# Patient Record
Sex: Female | Born: 1985 | Race: Black or African American | Hispanic: No | Marital: Single | Smoking: Never smoker
Health system: Southern US, Community
[De-identification: ages and names within clinical notes are randomized; demographics above are authoritative.]

## PROBLEM LIST (undated history)

## (undated) DIAGNOSIS — F419 Anxiety disorder, unspecified: Secondary | ICD-10-CM

## (undated) DIAGNOSIS — F32A Depression, unspecified: Secondary | ICD-10-CM

---

## 2021-06-06 ENCOUNTER — Encounter (HOSPITAL_COMMUNITY): Payer: Self-pay

## 2021-06-06 ENCOUNTER — Emergency Department (HOSPITAL_COMMUNITY)
Admission: EM | Admit: 2021-06-06 | Discharge: 2021-06-06 | Disposition: A | Discharge status: Home / Self Care | Payer: Medicaid Other | Attending: Emergency Medicine | Admitting: Emergency Medicine

## 2021-06-06 DIAGNOSIS — M549 Dorsalgia, unspecified: Secondary | ICD-10-CM | POA: Diagnosis present

## 2021-06-06 DIAGNOSIS — M545 Low back pain, unspecified: Secondary | ICD-10-CM | POA: Insufficient documentation

## 2021-06-06 MED ORDER — LIDOCAINE 5 % EX PTCH: 1.0000 | MEDICATED_PATCH | CUTANEOUS | 0 refills | Status: DC

## 2021-06-06 NOTE — ED Provider Notes (Signed)
[facility]-EMERGENCY DEPT Provider Note   CSN: [id number] Arrival date & time: 06/06/21  0840     History  Chief Complaint  Patient presents with   Back Pain    Fall    Allison Tapia is a 36 y.o. female presenting with a complaint of back pain.  She was seen at urgent care 3 days ago and prescribed prednisone, Flexeril and encouraged to use Vicks vapor rub in the area.  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  She feels as though her back pain is not going away.  Denies any saddle anesthesia, bowel/bladder dysfunction, fevers, chills or history of cancer.  No history of back surgeries but did fall down this morning in the bathroom due to her back locking up. Denies LOC or hitting her head.   Home Medications Prior to Admission medications   Not on File      Allergies    Patient has no allergy information on record.    Review of Systems   Review of Systems  Musculoskeletal:  Positive for back pain.  See HPI Physical Exam Updated Vital Signs BP (!) 159/99 (BP Location: Left Arm)    Pulse 73    Temp 98.3 F (36.8 C) (Oral)    Resp 20    SpO2 98%  Physical Exam Vitals and nursing note reviewed.  Constitutional:      Appearance: Normal appearance.  HENT:     Head: Normocephalic and atraumatic.  Eyes:     General: No scleral icterus.    Conjunctiva/sclera: Conjunctivae normal.  Pulmonary:     Effort: Pulmonary effort is normal. No respiratory distress.  Musculoskeletal:        General: Normal range of motion.     Comments: Full range of motion of all levels of the spine.  No midline tenderness.  Minor bilateral paraspinal tenderness of the lumbar spine.  Skin:    Findings: No rash.  Neurological:     Mental Status: She is alert.     Gait: Gait normal.  Psychiatric:        Mood and Affect: Mood normal.    ED Results / Procedures / Treatments   Labs (all labs ordered are listed, but only abnormal  results are displayed) Labs Reviewed - No data to display  EKG None  Radiology No results found.  Procedures Procedures    Medications Ordered in ED Medications - No data to display  ED Course/ Medical Decision Making/ A&P                           Medical Decision Making  36 year old presenting with back pain.  She stated that "I did not do what I was supposed to do." Continues to have back pain however was active during the past few days which was cautioned against.  No midline tenderness, no imaging necessary.  She is still on prednisone and Flexeril however states she has not been taking them regularly.  I encouraged her to use these medications and have also sent lidocaine patches to the pharmacy.  She will also be discharged with a work note for the weekend.  She is agreeable to the plan.  Final Clinical Impression(s) / ED Diagnoses Final diagnoses:  Acute bilateral low back pain without sciatica    Rx / DC Orders Results and diagnoses were explained to the patient. Return precautions  discussed in full. Patient had no additional questions and expressed complete understanding.   This chart was dictated using voice recognition software.  Despite best efforts to proofread,  errors can occur which can change the documentation meaning.    Saddie Benders, PA-C 06/06/21 1128    Rolan Bucco, MD 06/06/21 [phone]

## 2021-06-06 NOTE — Discharge Instructions (Addendum)
I have attached information about back pain to these discharge papers.  A work note is also supplied.  Use heat packs to increase the blood flow to your tightened muscles.  You may also alternate Tylenol and ibuprofen.

## 2021-06-06 NOTE — ED Triage Notes (Signed)
Pt reports she went to [facility] on Sunday (2/7) for low back pain after lifting something heavy at her home. Pt was given IM steroid shot, muscle relaxer's, and prednisone to take at home.   Pt reports she works night shift and fell last night in the bathroom at work around 2am. Pt went home and took a muscle relaxer around 3am. Pt took 1000mg  tylenol this morning.    A/Ox4 Ambulatory in triage.

## 2021-06-11 ENCOUNTER — Encounter (HOSPITAL_COMMUNITY): Payer: Self-pay

## 2021-06-11 ENCOUNTER — Other Ambulatory Visit: Payer: Self-pay

## 2021-06-11 ENCOUNTER — Emergency Department (HOSPITAL_COMMUNITY)
Admission: EM | Admit: 2021-06-11 | Discharge: 2021-06-11 | Disposition: A | Discharge status: Home / Self Care | Payer: Medicaid Other | Attending: Emergency Medicine | Admitting: Emergency Medicine

## 2021-06-11 ENCOUNTER — Emergency Department (HOSPITAL_COMMUNITY): Payer: Medicaid Other

## 2021-06-11 DIAGNOSIS — Z20822 Contact with and (suspected) exposure to covid-19: Secondary | ICD-10-CM | POA: Diagnosis not present

## 2021-06-11 DIAGNOSIS — J040 Acute laryngitis: Secondary | ICD-10-CM | POA: Insufficient documentation

## 2021-06-11 DIAGNOSIS — J029 Acute pharyngitis, unspecified: Secondary | ICD-10-CM | POA: Diagnosis present

## 2021-06-11 LAB — GROUP A STREP BY PCR: Group A Strep by PCR: NOT DETECTED

## 2021-06-11 LAB — RESP PANEL BY RT-PCR (FLU A&B, COVID) ARPGX2
Influenza A by PCR: NEGATIVE
Influenza B by PCR: NEGATIVE
SARS Coronavirus 2 by RT PCR: NEGATIVE

## 2021-06-11 MED ORDER — AZITHROMYCIN 250 MG PO TABS: 250.0000 mg | ORAL_TABLET | Freq: Every day | ORAL | 0 refills | Status: DC

## 2021-06-11 NOTE — ED Triage Notes (Signed)
Pt reports sore throat and cough for a few days.

## 2021-06-11 NOTE — ED Notes (Signed)
Pt waiting on test results, water provided at pts request

## 2021-06-11 NOTE — ED Provider Notes (Signed)
°  [facility]-EMERGENCY DEPT [facility] Emergency Department Provider Note MRN:  [id number]  Arrival date & time: 06/11/21     Chief Complaint   Sore Throat   History of Present Illness   Allison Tapia is a 36 y.o. year-old female presents to the ED with chief complaint of sore throat, cough, hoarseness that started 3 days ago.  She denies fever.  Denies any successful home treatments.  Reports gradually worsening symptoms.    Review of Systems  Pertinent review of systems noted in HPI.    Physical Exam   Vitals:   06/11/21 1732  BP: (!) 151/115  Pulse: 88  Resp: 18  Temp: 98.5 F (36.9 C)  SpO2: 100%    CONSTITUTIONAL:  nontoxic-appearing, NAD NEURO:  Alert and oriented x 3, CN 3-12 grossly intact EYES:  eyes equal and reactive ENT/NECK:  Supple, no stridor, but hoarse, oropharynx is erythematous, no exudates or abscess CARDIO:  normal rate, regular rhythm, appears well-perfused  PULM:  No respiratory distress, no wheezing GI/GU:  non-distended,  MSK/SPINE:  No gross deformities, no edema, moves all extremities  SKIN:  no rash, atraumatic   *Additional and/or pertinent findings included in MDM below  Diagnostic and Interventional Summary    EKG Interpretation  Date/Time:    Ventricular Rate:    PR Interval:    QRS Duration:   QT Interval:    QTC Calculation:   R Axis:     Text Interpretation:         Labs Reviewed  RESP PANEL BY RT-PCR (FLU A&B, COVID) ARPGX2  GROUP A STREP BY PCR    DG Chest 2 View  Final Result      Medications - No data to display   Procedures  /  Critical Care Procedures  ED Course and Medical Decision Making  I have reviewed the triage vital signs, the nursing notes, and pertinent available records from the EMR.  Complexity of Problems Addressed: Moderate Complexity: Acute illness with systemic symptoms, requiring diagnostic workup to rule out more severe disease.  ED Course:    After considering the following  differential, pneumonia, covid, flu, strep throat, asthma exacerbation, I agree with labs and CXR ordered in triage.  I personally interpreted the labs which are notable for negative covid, flu, strep .  Treatment and Plan:  Will trial treatment of laryngitis with azithro  Emergency department workup does not suggest an emergent condition requiring admission or immediate intervention beyond  what has been performed at this time. The patient is safe for discharge and has  been instructed to return immediately for worsening symptoms, change in  symptoms or any other concerns    Final Clinical Impressions(s) / ED Diagnoses     ICD-10-CM   1. Laryngitis  J04.0       ED Discharge Orders          Ordered    azithromycin (ZITHROMAX) 250 MG tablet  Daily        06/11/21 2102             Discharge Instructions Discussed with and Provided to Patient:   Discharge Instructions   None      Felipa Furnace 06/11/21 2103    Cheryll Cockayne, MD 06/15/21 [phone]

## 2021-06-11 NOTE — ED Provider Triage Note (Signed)
Emergency Medicine Provider Triage Evaluation Note  Allison Tapia , a 36 y.o. female  was evaluated in triage.  Pt complains of sore throat hoarseness of voice, congestion, and productive cough for the last 4 days..  Review of Systems  Positive: Cough, congestion, sore throat, hoarseness of voice, diarrhea Negative: Fevers, chills, nausea, vomiting  Physical Exam  BP (!) 151/115 (BP Location: Left Arm)    Pulse 88    Temp 98.5 F (36.9 C) (Oral)    Resp 18    SpO2 100%  Gen:   Awake, no distress   Resp:  Normal effort  MSK:   Moves extremities without difficulty  Other:  Hoarseness of voice.  Erythematous edematous tonsils, exam limited by patient participation.  Anterior cervical lymphadenopathy.  No anterior neck crepitus or tracheal deviation.  Patient tolerating her secretions well.  Medical Decision Making  Medically screening exam initiated at 5:47 PM.  Appropriate orders placed.  Allison Tapia was informed that the remainder of the evaluation will be completed by another provider, this initial triage assessment does not replace that evaluation, and the importance of remaining in the ED until their evaluation is complete.  This chart was dictated using voice recognition software, Dragon. Despite the best efforts of this provider to proofread and correct errors, errors may still occur which can change documentation meaning.    Paris Lore, PA-C 06/11/21 [phone]

## 2021-06-23 ENCOUNTER — Emergency Department (HOSPITAL_COMMUNITY)
Admission: EM | Admit: 2021-06-23 | Discharge: 2021-06-24 | Disposition: A | Discharge status: Home / Self Care | Payer: Medicaid Other | Attending: Emergency Medicine | Admitting: Emergency Medicine

## 2021-06-23 ENCOUNTER — Emergency Department (HOSPITAL_COMMUNITY): Payer: Medicaid Other

## 2021-06-23 ENCOUNTER — Encounter (HOSPITAL_COMMUNITY): Payer: Self-pay | Admitting: *Deleted

## 2021-06-23 ENCOUNTER — Other Ambulatory Visit: Payer: Self-pay

## 2021-06-23 DIAGNOSIS — Z20822 Contact with and (suspected) exposure to covid-19: Secondary | ICD-10-CM | POA: Insufficient documentation

## 2021-06-23 DIAGNOSIS — R059 Cough, unspecified: Secondary | ICD-10-CM | POA: Diagnosis present

## 2021-06-23 DIAGNOSIS — J189 Pneumonia, unspecified organism: Secondary | ICD-10-CM

## 2021-06-23 DIAGNOSIS — J168 Pneumonia due to other specified infectious organisms: Secondary | ICD-10-CM | POA: Diagnosis not present

## 2021-06-23 DIAGNOSIS — N9489 Other specified conditions associated with female genital organs and menstrual cycle: Secondary | ICD-10-CM | POA: Diagnosis not present

## 2021-06-23 LAB — CBC WITH DIFFERENTIAL/PLATELET
Abs Immature Granulocytes: 0.03 10*3/uL (ref 0.00–0.07)
Basophils Absolute: 0 10*3/uL (ref 0.0–0.1)
Basophils Relative: 1 %
Eosinophils Absolute: 0.3 10*3/uL (ref 0.0–0.5)
Eosinophils Relative: 5 %
HCT: 43 % (ref 36.0–46.0)
Hemoglobin: 13.9 g/dL (ref 12.0–15.0)
Immature Granulocytes: 1 %
Lymphocytes Relative: 18 %
Lymphs Abs: 1 10*3/uL (ref 0.7–4.0)
MCH: 28.4 pg (ref 26.0–34.0)
MCHC: 32.3 g/dL (ref 30.0–36.0)
MCV: 87.8 fL (ref 80.0–100.0)
Monocytes Absolute: 0.7 10*3/uL (ref 0.1–1.0)
Monocytes Relative: 12 %
Neutro Abs: 3.8 10*3/uL (ref 1.7–7.7)
Neutrophils Relative %: 63 %
Platelets: 241 10*3/uL (ref 150–400)
RBC: 4.9 MIL/uL (ref 3.87–5.11)
RDW: 13.8 % (ref 11.5–15.5)
WBC: 5.8 10*3/uL (ref 4.0–10.5)
nRBC: 0 % (ref 0.0–0.2)

## 2021-06-23 LAB — I-STAT BETA HCG BLOOD, ED (MC, WL, AP ONLY): I-stat hCG, quantitative: <5 m[IU]/mL (ref <5)

## 2021-06-23 NOTE — ED Provider Triage Note (Signed)
Emergency Medicine Provider Triage Evaluation Note  Allison Tapia , a 36 y.o. female  was evaluated in triage.  Pt complains of fevers, and coughing.  She was recently seen for laryngitis, started on azithromycin.  She states she was doing well until this weekend when she started to feel hot.  She only found out she was febrile today.  She states that she began feeling worse today and just generally unwell.   Physical Exam  BP (!) 142/101    Pulse (!) 115    Temp (!) 100.6 F (38.1 C) (Oral)    Resp 17    Ht 5\' 5"  (1.651 m)    Wt 113.4 kg    LMP  (LMP Unknown)    SpO2 95%    BMI 41.60 kg/m  Gen:   Awake, no distress   Resp:  Normal effort  Frequent cough MSK:   Moves extremities without difficulty  Other:  Normal speech.   Medical Decision Making  Medically screening exam initiated at 11:36 PM.  Appropriate orders placed.  Ambri Da was informed that the remainder of the evaluation will be completed by another provider, this initial triage assessment does not replace that evaluation, and the importance of remaining in the ED until their evaluation is complete.  Patient was recently seen and treated with azithromycin for laryngitis. She has improved followed by a worsening today, and she is tachycardic and febrile with ongoing cough. We will order labs.  While she had negative COVID testing at the time of her laryngitis diagnosis given that she has been in healthcare facility will recheck this today.   Lorin Glass, [place] 06/24/21 0004

## 2021-06-23 NOTE — ED Triage Notes (Addendum)
Pt says dx with laryngitis last week. Pt says she was getting ready to go to work today and started to feel hot all over and vomited x2. Has had a cough, says that when she starts coughing she feels like she cannot stop and then starts vomiting.

## 2021-06-24 LAB — RESP PANEL BY RT-PCR (FLU A&B, COVID) ARPGX2
Influenza A by PCR: NEGATIVE
Influenza B by PCR: NEGATIVE
SARS Coronavirus 2 by RT PCR: NEGATIVE

## 2021-06-24 LAB — COMPREHENSIVE METABOLIC PANEL
ALT: 20 U/L (ref 0–44)
AST: 19 U/L (ref 15–41)
Albumin: 3.6 g/dL (ref 3.5–5.0)
Alkaline Phosphatase: 58 U/L (ref 38–126)
Anion gap: 6 (ref 5–15)
BUN: 10 mg/dL (ref 6–20)
CO2: 25 mmol/L (ref 22–32)
Calcium: 8.9 mg/dL (ref 8.9–10.3)
Chloride: 106 mmol/L (ref 98–111)
Creatinine, Ser: 0.73 mg/dL (ref 0.44–1.00)
GFR, Estimated: >60 mL/min (ref >60)
Glucose, Bld: 112 mg/dL — ABNORMAL HIGH (ref 70–99)
Potassium: 3.6 mmol/L (ref 3.5–5.1)
Sodium: 137 mmol/L (ref 135–145)
Total Bilirubin: 0.3 mg/dL (ref 0.3–1.2)
Total Protein: 7.4 g/dL (ref 6.5–8.1)

## 2021-06-24 LAB — URINALYSIS, ROUTINE W REFLEX MICROSCOPIC
Bilirubin Urine: NEGATIVE
Glucose, UA: NEGATIVE mg/dL
Ketones, ur: 5 mg/dL — AB
Nitrite: NEGATIVE
Protein, ur: 30 mg/dL — AB
Specific Gravity, Urine: 1.03 (ref 1.005–1.030)
pH: 5 (ref 5.0–8.0)

## 2021-06-24 MED ORDER — ONDANSETRON 4 MG PO TBDP: 4.0000 mg | ORAL_TABLET | Freq: Three times a day (TID) | ORAL | 0 refills | Status: DC | PRN

## 2021-06-24 MED ORDER — ONDANSETRON 4 MG PO TBDP: 4.0000 mg | ORAL_TABLET | Freq: Three times a day (TID) | ORAL | 0 refills | Status: AC | PRN

## 2021-06-24 MED ORDER — AMOXICILLIN-POT CLAVULANATE 875-125 MG PO TABS
1.0000 | ORAL_TABLET | Freq: Once | ORAL | Status: AC
  Administered 2021-06-24: 1 via ORAL
  Filled 2021-06-24: qty 1

## 2021-06-24 MED ORDER — TUSSIN DM 100-10 MG/5ML PO LIQD
10.0000 mL | Freq: Four times a day (QID) | ORAL | 0 refills | Status: DC | PRN
Start: 2021-06-24 — End: 2021-06-24

## 2021-06-24 MED ORDER — TUSSIN DM 100-10 MG/5ML PO LIQD: 10.0000 mL | Freq: Four times a day (QID) | ORAL | 0 refills | Status: DC | PRN

## 2021-06-24 MED ORDER — AMOXICILLIN-POT CLAVULANATE 875-125 MG PO TABS: 1.0000 | ORAL_TABLET | Freq: Two times a day (BID) | ORAL | 0 refills | Status: DC

## 2021-06-24 MED ORDER — AMOXICILLIN-POT CLAVULANATE 875-125 MG PO TABS: 1.0000 | ORAL_TABLET | Freq: Two times a day (BID) | ORAL | 0 refills | Status: AC

## 2021-06-24 NOTE — ED Provider Notes (Signed)
[facility]-EMERGENCY DEPT Provider Note  CSN: [id number] Arrival date & time: 06/23/21 2304  Chief Complaint(s) Cough  HPI Allison Tapia is a 36 y.o. female recently treated for laryngitis returns today for worsening cough and congestion with recurrent fevers.  Patient is endorsing emesis related to severe coughing.  No abdominal pain.  No other physical complaints.  The history is provided by the patient.   Past Medical History History reviewed. No pertinent past medical history. There are no problems to display for this patient.  Home Medication(s) Prior to Admission medications   Medication Sig Start Date End Date Taking? Authorizing Provider  amoxicillin-clavulanate (AUGMENTIN) 875-125 MG tablet Take 1 tablet by mouth every 12 (twelve) hours for 7 days. 06/24/21 07/01/21 Yes Elijan Googe, Amadeo Garnet, MD  dextromethorphan-guaiFENesin (TUSSIN DM) 10-100 MG/5ML liquid Take 10 mLs by mouth every 6 (six) hours as needed for cough. 06/24/21  Yes Kenleigh Toback, Amadeo Garnet, MD  ondansetron (ZOFRAN-ODT) 4 MG disintegrating tablet Take 1 tablet (4 mg total) by mouth every 8 (eight) hours as needed for up to 3 days for nausea or vomiting. 06/24/21 06/27/21 Yes Aikeem Lilley, Amadeo Garnet, MD  azithromycin (ZITHROMAX) 250 MG tablet Take 1 tablet (250 mg total) by mouth daily. Take first 2 tablets together, then 1 every day until finished. 06/11/21   Roxy Horseman, PA-C  lidocaine (LIDODERM) 5 % Place 1 patch onto the skin daily. Remove & Discard patch within 12 hours or as directed by MD 06/06/21   Redwine, [name], PA-C                                                                                                                                    Allergies Patient has no known allergies.  Review of Systems Review of Systems As noted in HPI  Physical Exam Vital Signs  I have reviewed the triage vital signs BP (!) 141/101    Pulse 89    Temp (!) 100.6 F (38.1 C) (Oral)    Resp  18    Ht 5\' 5"  (1.651 m)    Wt 113.4 kg    LMP  (LMP Unknown)    SpO2 98%    BMI 41.60 kg/m   Physical Exam Vitals reviewed.  Constitutional:      General: She is not in acute distress.    Appearance: She is well-developed. She is not diaphoretic.  HENT:     Head: Normocephalic and atraumatic.     Nose: Nose normal.  Eyes:     General: No scleral icterus.       Right eye: No discharge.        Left eye: No discharge.     Conjunctiva/sclera: Conjunctivae normal.     Pupils: Pupils are equal, round, and reactive to light.  Cardiovascular:     Rate and Rhythm: Normal rate and regular rhythm.     Heart sounds: No murmur heard.  No friction rub. No gallop.  Pulmonary:     Effort: Pulmonary effort is normal. No respiratory distress.     Breath sounds: No stridor. Examination of the right-lower field reveals rhonchi and rales. Rhonchi and rales present. No wheezing.  Abdominal:     General: There is no distension.     Palpations: Abdomen is soft.     Tenderness: There is no abdominal tenderness.  Musculoskeletal:        General: No tenderness.     Cervical back: Normal range of motion and neck supple.  Skin:    General: Skin is warm and dry.     Findings: No erythema or rash.  Neurological:     Mental Status: She is alert and oriented to person, place, and time.    ED Results and Treatments Labs (all labs ordered are listed, but only abnormal results are displayed) Labs Reviewed  COMPREHENSIVE METABOLIC PANEL - Abnormal; Notable for the following components:      Result Value   Glucose, Bld 112 (*)    All other components within normal limits  URINALYSIS, ROUTINE W REFLEX MICROSCOPIC - Abnormal; Notable for the following components:   APPearance HAZY (*)    Hgb urine dipstick MODERATE (*)    Ketones, ur 5 (*)    Protein, ur 30 (*)    Leukocytes,Ua LARGE (*)    Bacteria, UA RARE (*)    All other components within normal limits  RESP PANEL BY RT-PCR (FLU A&B, COVID)  ARPGX2  CBC WITH DIFFERENTIAL/PLATELET  LACTIC ACID, PLASMA  LACTIC ACID, PLASMA  I-STAT BETA HCG BLOOD, ED ([facility], [facility], [facility] ONLY)                                                                                                                         EKG  EKG Interpretation  Date/Time:    Ventricular Rate:    PR Interval:    QRS Duration:   QT Interval:    QTC Calculation:   R Axis:     Text Interpretation:         Radiology DG Chest 2 View  Result Date: 06/23/2021 CLINICAL DATA:  Cough, fever, body aches 1 week EXAM: CHEST - 2 VIEW COMPARISON:  06/11/2021 FINDINGS: Frontal and lateral views of the chest demonstrate an unremarkable cardiac silhouette. Patchy right lower lobe consolidation consistent with pneumonia. No effusion or pneumothorax. No acute bony abnormalities. IMPRESSION: 1. Patchy right lower lobe bronchopneumonia. Electronically Signed   By: Sharlet Salina M.D.   On: 06/23/2021 23:56    Pertinent labs & imaging results that were available during my care of the patient were reviewed by me and considered in my medical decision making (see MDM for details).  Medications Ordered in ED Medications  amoxicillin-clavulanate (AUGMENTIN) 875-125 MG per tablet 1 tablet (1 tablet Oral Given 06/24/21 0354)  Procedures Procedures  (including critical care time)  Medical Decision Making / ED Course         Patient noted to be febrile.  Recent laryngitis.  We will need to assess for postviral pneumonia.  We will get labs to rule out sepsis.   Work-up ordered to assess concerns above.  Labs and imaging independently interpreted by me and noted below: CBC without leukocytosis or anemia No significant electrolyte derangements or renal sufficiency. UA was obtained in triage and contaminated.  Patient is not having any urinary symptoms concerning for  infection. Chest x-ray is notable for right base pneumonia  Management: Monitored. Patient is hemodynamically stable nontachycardic and satting well on room air. Patient's work-up is not consistent with sepsis and she does not require admission to the hospital Oral dose of antibiotics   Final Clinical Impression(s) / ED Diagnoses Final diagnoses:  Pneumonia of right lower lobe due to infectious organism   The patient appears reasonably screened and/or stabilized for discharge and I doubt any other medical condition or other [facility] requiring further screening, evaluation, or treatment in the ED at this time prior to discharge. Safe for discharge with strict return precautions.  Disposition: Discharge  Condition: Good  I have discussed the results, Dx and Tx plan with the patient/family who expressed understanding and agree(s) with the plan. Discharge instructions discussed at length. The patient/family was given strict return precautions who verbalized understanding of the instructions. No further questions at time of discharge.    ED Discharge Orders          Ordered    ondansetron (ZOFRAN-ODT) 4 MG disintegrating tablet  Every 8 hours PRN        06/24/21 0401    amoxicillin-clavulanate (AUGMENTIN) 875-125 MG tablet  Every 12 hours        06/24/21 0401    dextromethorphan-guaiFENesin (TUSSIN DM) 10-100 MG/5ML liquid  Every 6 hours PRN        06/24/21 0402            Follow Up: Primary care provider  Call  if you do not have a primary care physician, contact [facility] at [phone] for referral           This chart was dictated using voice recognition software.  Despite best efforts to proofread,  errors can occur which can change the documentation meaning.    Nira Conn, MD 06/24/21 [phone]

## 2021-07-21 ENCOUNTER — Other Ambulatory Visit: Payer: Self-pay

## 2021-07-21 ENCOUNTER — Encounter (HOSPITAL_COMMUNITY): Payer: Self-pay

## 2021-07-21 ENCOUNTER — Emergency Department (HOSPITAL_COMMUNITY)
Admission: EM | Admit: 2021-07-21 | Discharge: 2021-07-21 | Disposition: A | Discharge status: Home / Self Care | Payer: Medicaid Other | Attending: Student | Admitting: Student

## 2021-07-21 DIAGNOSIS — J029 Acute pharyngitis, unspecified: Secondary | ICD-10-CM

## 2021-07-21 DIAGNOSIS — J069 Acute upper respiratory infection, unspecified: Secondary | ICD-10-CM | POA: Insufficient documentation

## 2021-07-21 DIAGNOSIS — R0981 Nasal congestion: Secondary | ICD-10-CM | POA: Diagnosis present

## 2021-07-21 NOTE — ED Provider Notes (Signed)
?[facility]-EMERGENCY DEPT ?Provider Note ? ? ?CSN: [id number] ?Arrival date & time: 07/21/21  1945 ? ?  ? ?History ? ?Chief Complaint  ?Patient presents with  ? Nasal Congestion  ? ? ?Allison Tapia is a 36 y.o. female. ? ?Patient with no past medical history presents to the emergency department for evaluation of runny nose and nasal congestion starting this morning.  While waiting in the emergency department she has developed a sore throat and headache.  No fevers, ear pain, vomiting.  No abdominal pain or urinary symptoms.  She has used over-the-counter cold medication at home which has helped.  Her daughter is sick with similar symptoms, preceding her own.  The onset of this condition was acute. The course is constant. Aggravating factors: none. Alleviating factors: none.  ? ? ? ?  ? ?Home Medications ?Prior to Admission medications   ?Medication Sig Start Date End Date Taking? Authorizing Provider  ?azithromycin (ZITHROMAX) 250 MG tablet Take 1 tablet (250 mg total) by mouth daily. Take first 2 tablets together, then 1 every day until finished. 06/11/21   Roxy Horseman, PA-C  ?dextromethorphan-guaiFENesin (TUSSIN DM) 10-100 MG/5ML liquid Take 10 mLs by mouth every 6 (six) hours as needed for cough. 06/24/21   Nira Conn, MD  ?lidocaine (LIDODERM) 5 % Place 1 patch onto the skin daily. Remove & Discard patch within 12 hours or as directed by MD 06/06/21   Redwine, [name], PA-C  ?   ? ?Allergies    ?Patient has no known allergies.   ? ?Review of Systems   ?Review of Systems ? ?Physical Exam ?Updated Vital Signs ?BP (!) 163/125 (BP Location: Right Arm)   Pulse 97   Temp 98.4 ?F (36.9 ?C) (Oral)   Resp 18   Ht 5\' 5"  (1.651 m)   Wt 113.4 kg   SpO2 97%   BMI 41.60 kg/m?  ?Physical Exam ?Vitals and nursing note reviewed.  ?Constitutional:   ?   General: She is not in acute distress. ?   Appearance: She is well-developed.  ?HENT:  ?   Head: Normocephalic and atraumatic.  ?    Right Ear: External ear normal.  ?   Left Ear: External ear normal.  ?   Nose: Congestion and rhinorrhea present.  ?   Mouth/Throat:  ?   Pharynx: Posterior oropharyngeal erythema present. No oropharyngeal exudate.  ?Eyes:  ?   Conjunctiva/sclera: Conjunctivae normal.  ?Cardiovascular:  ?   Rate and Rhythm: Normal rate and regular rhythm.  ?   Heart sounds: No murmur heard. ?Pulmonary:  ?   Effort: No respiratory distress.  ?   Breath sounds: No wheezing, rhonchi or rales.  ?Abdominal:  ?   Palpations: Abdomen is soft.  ?   Tenderness: There is no abdominal tenderness. There is no guarding or rebound.  ?Musculoskeletal:  ?   Cervical back: Normal range of motion and neck supple.  ?   Right lower leg: No edema.  ?   Left lower leg: No edema.  ?Skin: ?   General: Skin is warm and dry.  ?   Findings: No rash.  ?Neurological:  ?   General: No focal deficit present.  ?   Mental Status: She is alert. Mental status is at baseline.  ?   Motor: No weakness.  ?Psychiatric:     ?   Mood and Affect: Mood normal.  ? ? ?ED Results / Procedures / Treatments   ?Labs ?(all labs ordered are  listed, but only abnormal results are displayed) ?Labs Reviewed - No data to display ? ?EKG ?None ? ?Radiology ?No results found. ? ?Procedures ?Procedures  ? ? ?Medications Ordered in ED ?Medications - No data to display ? ?ED Course/ Medical Decision Making/ A&P ?  ? ?Patient seen and examined. History obtained directly from patient.  Discussed COVID, flu, strep testing --patient declines.  She is requesting a work note as she missed work today. ? ?Most recent vital signs reviewed and are as follows: ?BP (!) 163/125 (BP Location: Right Arm)   Pulse 97   Temp 98.4 ?F (36.9 ?C) (Oral)   Resp 18   Ht 5\' 5"  (1.651 m)   Wt 113.4 kg   SpO2 97%   BMI 41.60 kg/m?  ? ?Initial impression: Upper respiratory infection ? ?Home treatment plan: Symptomatic care ? ?Return instructions discussed with patient: Worsening sore throat, trouble swallowing,  fever ? ?Follow-up instructions discussed with patient: Encouraged follow-up in the next 3 to 5 days if not improving ? ?                        ?Medical Decision Making ? ?Patient with symptoms consistent with a viral syndrome. Vitals are stable, no fever. No signs of dehydration. Lung exam normal, no signs of pneumonia. Supportive therapy indicated with return if symptoms worsen.   ? ?BP high, no obvious signs of endorgan damage, encouraged recheck. ? ? ? ? ? ? ? ? ?Final Clinical Impression(s) / ED Diagnoses ?Final diagnoses:  ?Upper respiratory tract infection, unspecified type  ?Sore throat  ? ? ?Rx / DC Orders ?ED Discharge Orders   ? ? None  ? ?  ? ? ?  ? , PA-C ?07/21/21 2242 ? ?  ?2243, MD ?07/22/21 0038 ? ?

## 2021-07-21 NOTE — ED Triage Notes (Addendum)
Pt presents to ED with c/o nasal congestion x 1 day. Pt states she needs a work for note.  ?

## 2021-07-21 NOTE — Discharge Instructions (Addendum)
Please read and follow all provided instructions. ? ?Your diagnoses today include:  ?1. Upper respiratory tract infection, unspecified type   ?2. Sore throat   ? ? ?You appear to have an upper respiratory infection (URI). An upper respiratory tract infection, or cold, is a viral infection of the air passages leading to the lungs. It should improve gradually after 5-7 days. You may have a lingering cough that lasts for 2- 4 weeks after the infection. ? ?Tests performed today include: ?Vital signs. See below for your results today.  ? ?Medications prescribed:  ? ?Take any prescribed medications only as directed. Treatment for your infection is aimed at treating the symptoms. There are no medications, such as antibiotics, that will cure your infection.  ? ?Home care instructions:  ?You can take Tylenol and/or Ibuprofen as directed on the packaging for fever reduction and pain relief.  ?  ?For cough: honey 1/2 to 1 teaspoon (you can dilute the honey in water or another fluid).  You can also use guaifenesin and dextromethorphan for cough. You can use a humidifier for chest congestion and cough.  If you don't have a humidifier, you can sit in the bathroom with the hot shower running.    ?  ?For sore throat: try warm salt water gargles, cepacol lozenges, throat spray, warm tea or water with lemon/honey, popsicles or ice, or OTC cold relief medicine for throat discomfort.  ?  ?For congestion: take a daily anti-histamine like Zyrtec, Claritin, and a oral decongestant, such as pseudoephedrine.  You can also use Flonase 1-2 sprays in each nostril daily.  ?  ?It is important to stay hydrated: drink plenty of fluids (water, gatorade/powerade/pedialyte, juices, or teas) to keep your throat moisturized and help further relieve irritation/discomfort.  ? ?Your illness is contagious and can be spread to others, especially during the first 3 or 4 days. It cannot be cured by antibiotics or other medicines. Take basic precautions such  as washing your hands often, covering your mouth when you cough or sneeze, and avoiding public places where you could spread your illness to others.  ? ?Please continue drinking plenty of fluids.  Use over-the-counter medicines as needed as directed on packaging for symptom relief.  You may also use ibuprofen or tylenol as directed on packaging for pain or fever.  Do not take multiple medicines containing Tylenol or acetaminophen to avoid taking too much of this medication. ? ?Follow-up instructions: ?Please follow-up with your primary care provider in the next 3 days for further evaluation of your symptoms if you are not feeling better.  ? ?Return instructions:  ?Please return to the Emergency Department if you experience worsening symptoms.  ?RETURN IMMEDIATELY IF you develop shortness of breath, confusion or altered mental status, a new rash, become dizzy, faint, or poorly responsive, or are unable to be cared for at home. ?Please return if you have persistent vomiting and cannot keep down fluids or develop a fever that is not controlled by tylenol or motrin.   ?Please return if you have any other emergent concerns. ? ?Additional Information: ? ?Your vital signs today were: ?BP (!) 163/125 (BP Location: Right Arm)   Pulse 97   Temp 98.4 ?F (36.9 ?C) (Oral)   Resp 18   Ht 5\' 5"  (1.651 m)   Wt 113.4 kg   SpO2 97%   BMI 41.60 kg/m?  ?If your blood pressure (BP) was elevated above 135/85 this visit, please have this repeated by your doctor within one month. ?-------------- ? ?

## 2022-01-26 ENCOUNTER — Other Ambulatory Visit: Payer: Self-pay

## 2022-01-26 ENCOUNTER — Encounter (HOSPITAL_COMMUNITY): Payer: Self-pay

## 2022-01-26 ENCOUNTER — Emergency Department (HOSPITAL_BASED_OUTPATIENT_CLINIC_OR_DEPARTMENT_OTHER)
Admission: RE | Admit: 2022-01-26 | Discharge: 2022-01-26 | Disposition: A | Discharge status: Home / Self Care | Payer: Medicaid Other | Source: Ambulatory Visit | Attending: Emergency Medicine | Admitting: Emergency Medicine

## 2022-01-26 ENCOUNTER — Emergency Department (HOSPITAL_COMMUNITY)
Admission: EM | Admit: 2022-01-26 | Discharge: 2022-01-26 | Disposition: A | Discharge status: Home / Self Care | Payer: Medicaid Other | Attending: Emergency Medicine | Admitting: Emergency Medicine

## 2022-01-26 DIAGNOSIS — M79662 Pain in left lower leg: Secondary | ICD-10-CM | POA: Diagnosis present

## 2022-01-26 DIAGNOSIS — M79605 Pain in left leg: Secondary | ICD-10-CM

## 2022-01-26 DIAGNOSIS — R52 Pain, unspecified: Secondary | ICD-10-CM | POA: Diagnosis not present

## 2022-01-26 MED ORDER — NAPROXEN 500 MG PO TABS: 500.0000 mg | ORAL_TABLET | Freq: Two times a day (BID) | ORAL | 0 refills | Status: DC

## 2022-01-26 NOTE — ED Triage Notes (Addendum)
Pt reports left leg pain from knee radiating to ankle with swelling x1 week  Reports using ice, ace wrap, biofreeze, tylenol, and ibuprofen with relief.   Pain started after workings multiple 16hr shifts, pt normally works 8hr shifts.

## 2022-01-26 NOTE — Progress Notes (Signed)
VASCULAR LAB    Left lower extremity venous duplex has been performed.  See CV proc for preliminary results.   Jillien Yakel, RVT 01/26/2022, 10:58 AM

## 2022-01-26 NOTE — ED Provider Notes (Signed)
[facility]-EMERGENCY DEPT Provider Note   CSN: [id number] Arrival date & time: 01/26/22  0225     History  Chief Complaint  Patient presents with   Leg Pain    [facility]ia is a 36 y.o. female.  Patient is a 36 year old female with no significant past medical history.  Patient presenting today with complaints of left leg pain.  She describes pain in her lower leg from below the knee down to the ankle.  The pain is primarily in the front of the leg.  She denies any specific injury or trauma, but does tell me that she has been on her feet much more lately at work due to pulling double shifts.  She denies any chest pain or difficulty breathing.  Pain is worse when she ambulates with no alleviating factors.  The history is provided by the patient.       Home Medications Prior to Admission medications   Medication Sig Start Date End Date Taking? Authorizing Provider  azithromycin (ZITHROMAX) 250 MG tablet Take 1 tablet (250 mg total) by mouth daily. Take first 2 tablets together, then 1 every day until finished. 06/11/21   Roxy Horseman, PA-C  dextromethorphan-guaiFENesin (TUSSIN DM) 10-100 MG/5ML liquid Take 10 mLs by mouth every 6 (six) hours as needed for cough. 06/24/21   Cardama, Amadeo Garnet, MD  lidocaine (LIDODERM) 5 % Place 1 patch onto the skin daily. Remove & Discard patch within 12 hours or as directed by MD 06/06/21   Redwine, [name], PA-C      Allergies    Patient has no known allergies.    Review of Systems   Review of Systems  All other systems reviewed and are negative.   Physical Exam Updated Vital Signs BP (!) 154/103 (BP Location: Left Arm)   Pulse 83   Temp 98.6 F (37 C) (Oral)   Resp 18   Ht 5\' 5"  (1.651 m)   Wt 127.9 kg   SpO2 98%   BMI 46.93 kg/m  Physical Exam Vitals and nursing note reviewed.  Constitutional:      General: She is not in acute distress.    Appearance: Normal appearance. She is not ill-appearing.   HENT:     Head: Normocephalic and atraumatic.  Pulmonary:     Effort: Pulmonary effort is normal.  Musculoskeletal:     Comments: Left lower extremity is grossly normal in appearance.  There is tenderness to palpation to the soft tissues of the anterior lower leg.  DP pulses are easily palpable and motor and sensation are intact throughout the entire foot.  Skin:    General: Skin is warm and dry.  Neurological:     Mental Status: She is alert and oriented to person, place, and time.     ED Results / Procedures / Treatments   Labs (all labs ordered are listed, but only abnormal results are displayed) Labs Reviewed - No data to display  EKG None  Radiology No results found.  Procedures Procedures    Medications Ordered in ED Medications - No data to display  ED Course/ Medical Decision Making/ A&P  Patient presenting with leg pain as described in the HPI.  I suspect a musculoskeletal etiology due to her increased volume of work.  Vital signs are stable and physical examination is remarkable for tenderness to palpation in the soft tissues of the lower leg.  ' sign is absent.  I doubt DVT, but will arrange an ultrasound for the  morning as they are not here at night to perform the study.  Patient will also be prescribed naproxen for what I suspect is a musculoskeletal soft tissue pain.  Final Clinical Impression(s) / ED Diagnoses Final diagnoses:  None    Rx / DC Orders ED Discharge Orders     None         [name], MD 01/26/22 [phone]

## 2022-01-26 NOTE — Discharge Instructions (Signed)
Begin taking naproxen as prescribed.  Return today at the given time for an ultrasound of your leg to rule out a blood clot.  Rest.

## 2022-04-30 ENCOUNTER — Other Ambulatory Visit: Payer: Self-pay

## 2022-04-30 ENCOUNTER — Emergency Department (HOSPITAL_COMMUNITY)
Admission: EM | Admit: 2022-04-30 | Discharge: 2022-04-30 | Disposition: A | Discharge status: Home / Self Care | Payer: BC Managed Care – PPO | Attending: Emergency Medicine | Admitting: Emergency Medicine

## 2022-04-30 ENCOUNTER — Encounter (HOSPITAL_COMMUNITY): Payer: Self-pay

## 2022-04-30 DIAGNOSIS — J029 Acute pharyngitis, unspecified: Secondary | ICD-10-CM | POA: Diagnosis not present

## 2022-04-30 MED ORDER — DIPHENHYDRAMINE HCL 50 MG/ML IJ SOLN: 50.0000 mg | Freq: Once | INTRAMUSCULAR | Status: DC

## 2022-04-30 MED ORDER — ACETAMINOPHEN 160 MG/5ML PO SOLN
650.0000 mg | Freq: Once | ORAL | Status: AC
  Administered 2022-04-30: 650 mg via ORAL
  Filled 2022-04-30: qty 20.3

## 2022-04-30 MED ORDER — DEXAMETHASONE 4 MG PO TABS
10.0000 mg | ORAL_TABLET | Freq: Once | ORAL | Status: AC
  Administered 2022-04-30: 10 mg via ORAL
  Filled 2022-04-30: qty 1

## 2022-04-30 MED ORDER — DEXAMETHASONE SODIUM PHOSPHATE 10 MG/ML IJ SOLN: 10.0000 mg | Freq: Once | INTRAMUSCULAR | Status: DC

## 2022-04-30 MED ORDER — FAMOTIDINE IN NACL 20-0.9 MG/50ML-% IV SOLN: 20.0000 mg | Freq: Once | INTRAVENOUS | Status: DC

## 2022-04-30 NOTE — ED Provider Notes (Signed)
[facility] Provider Note   CSN: [id number] Arrival date & time: 04/30/22  1724     History  Chief Complaint  Patient presents with   Sore Throat   Oral Swelling    Allison Tapia is a 37 y.o. female.  37 yo F with a cc of a sore throat.  This been going on for about 3 days now.  No known sick contacts.  Coughing ingesting a bit.  She feels like her throat is gotten a bit worse.  Had some painful swallowing.  Went to urgent care and was seen and started on antibiotics and they obtained an x-ray of her neck.  She was called back and told that the x-ray did not look good and she should come to the ED for evaluation.   Sore Throat       Home Medications Prior to Admission medications   Medication Sig Start Date End Date Taking? Authorizing Provider  azithromycin (ZITHROMAX) 250 MG tablet Take 1 tablet (250 mg total) by mouth daily. Take first 2 tablets together, then 1 every day until finished. 06/11/21   Montine Circle, PA-C  dextromethorphan-guaiFENesin (TUSSIN DM) 10-100 MG/5ML liquid Take 10 mLs by mouth every 6 (six) hours as needed for cough. 06/24/21   Cardama, [name], MD  lidocaine (LIDODERM) 5 % Place 1 patch onto the skin daily. Remove & Discard patch within 12 hours or as directed by MD 06/06/21   Redwine, [name], PA-C  naproxen (NAPROSYN) 500 MG tablet Take 1 tablet (500 mg total) by mouth 2 (two) times daily with a meal. 01/26/22   [name], MD      Allergies    Patient has no known allergies.    Review of Systems   Review of Systems  Physical Exam Updated Vital Signs BP (!) 149/103 (BP Location: Left Arm)   Pulse 100   Temp (!) 100.4 F (38 C) (Oral)   Resp 18   Ht 5\' 5"  (1.651 m)   Wt 113.4 kg   SpO2 98%   BMI 41.60 kg/m  Physical Exam Vitals and nursing note reviewed.  Constitutional:      General: She is not in acute distress.    Appearance: She is well-developed. She is not diaphoretic.  HENT:      Head: Normocephalic and atraumatic.     Mouth/Throat:     Pharynx: Uvula midline.     Comments: Swollen turbinates, posterior nasal drip, no noted sinus ttp, tm normal bilaterally.   Eyes:     Pupils: Pupils are equal, round, and reactive to light.  Cardiovascular:     Rate and Rhythm: Normal rate and regular rhythm.     Heart sounds: No murmur heard.    No friction rub. No gallop.  Pulmonary:     Effort: Pulmonary effort is normal.     Breath sounds: No wheezing or rales.  Abdominal:     General: There is no distension.     Palpations: Abdomen is soft.     Tenderness: There is no abdominal tenderness.  Musculoskeletal:        General: No tenderness.     Cervical back: Normal range of motion and neck supple.  Skin:    General: Skin is warm and dry.  Neurological:     Mental Status: She is alert and oriented to person, place, and time.  Psychiatric:        Behavior: Behavior normal.     ED Results /  Procedures / Treatments   Labs (all labs ordered are listed, but only abnormal results are displayed) Labs Reviewed - No data to display  EKG None  Radiology No results found.  Procedures Procedures    Medications Ordered in ED Medications  acetaminophen (TYLENOL) 160 MG/5ML solution 650 mg (has no administration in time range)  dexamethasone (DECADRON) tablet 10 mg (has no administration in time range)    ED Course/ Medical Decision Making/ A&P                           Medical Decision Making  37 yo F with a chief complaint of a sore throat.  Since that the patient is a viral syndrome based on history and physical exam.  She was seen in urgent care today and was told she had an abnormal soft tissue neck film and was sent here for evaluation.  She is able to range her neck without pain.  Is tolerating secretions without difficulty.  Uvula is midline.  No obvious tonsillar swelling or exudates.  Will give a dose of Decadron here.  Continue to treat with antibiotics.   Have her follow-up with her family doctor in the office.  6:26 PM:  I have discussed the diagnosis/risks/treatment options with the patient.  Evaluation and diagnostic testing in the emergency department does not suggest an emergent condition requiring admission or immediate intervention beyond what has been performed at this time.  They will follow up with PCP. We also discussed returning to the ED immediately if new or worsening sx occur. We discussed the sx which are most concerning (e.g., sudden worsening pain, fever, inability to tolerate by mouth) that necessitate immediate return. Medications administered to the patient during their visit and any new prescriptions provided to the patient are listed below.  Medications given during this visit Medications  acetaminophen (TYLENOL) 160 MG/5ML solution 650 mg (has no administration in time range)  dexamethasone (DECADRON) tablet 10 mg (has no administration in time range)     The patient appears reasonably screen and/or stabilized for discharge and I doubt any other medical condition or other [facility] requiring further screening, evaluation, or treatment in the ED at this time prior to discharge.          Final Clinical Impression(s) / ED Diagnoses Final diagnoses:  Pharyngitis, unspecified etiology    Rx / DC Orders ED Discharge Orders     None         [name], DO 04/30/22 1826

## 2022-04-30 NOTE — ED Triage Notes (Addendum)
Patient states she has had a sore throat x 3 days. Today, the patient states she feels like her throat is swelling. Patient c/o severe throat pain. Patient states she is able to swallow her saliva, but has increased pain. Patient went to an UC today and patient states she was given a shot, but does not know what it was.

## 2022-04-30 NOTE — ED Provider Triage Note (Signed)
Emergency Medicine Provider Triage Evaluation Note  [facility]ison [facility]ia , a 37 y.o. female  was evaluated in triage.  Pt complains of sore throat x 3 days with increased difficulty swallowing and hoarseness. Evaluated at urgent care today with negative strep swab. Given IM shot of unknown medication and referred to ED. Febrile on arrival. Reports ability to tolerate secretions but feels like it is becoming increasingly difficult. No vomiting, diarrhea, chest pain, shortness of breath, rash. No known new exposure or medications prior to onset of symptoms. No history of similar symptoms or allergic reaction.    Review of Systems  Positive: See HPI Negative: See HPI  Physical Exam  BP (!) 149/103 (BP Location: Left Arm)   Pulse 100   Temp (!) 100.4 F (38 C) (Oral)   Resp 18   Ht 5\' 5"  (1.651 m)   Wt 113.4 kg   SpO2 98%   BMI 41.60 kg/m  Gen:   Awake, moderate distress, tolerating secretions, slightly muffled voice Resp:  Normal effort LCTA MSK:   Moves extremities without difficulty  Other:  Moderate right sided tonsillar swelling, erythema, exudate, difficulty with examination due to enlarged tongue but was able to get quick visualization of airway which was patent and without significant pharyngeal swelling  Medical Decision Making  Medically screening exam initiated at 6:12 PM.  Appropriate orders placed.  Verlee Pope was informed that the remainder of the evaluation will be completed by another provider, this initial triage assessment does not replace that evaluation, and the importance of remaining in the ED until their evaluation is complete.  SHOT OF DECADRON ORDERED. NEXT BED.    Suzzette Righter, PA-C 04/30/22 1816

## 2022-04-30 NOTE — Discharge Instructions (Signed)
Please return for worsening swelling difficulty breathing or swallowing.  Take the antibiotics as prescribed.

## 2022-06-01 ENCOUNTER — Other Ambulatory Visit: Payer: Self-pay

## 2022-06-01 ENCOUNTER — Encounter (HOSPITAL_BASED_OUTPATIENT_CLINIC_OR_DEPARTMENT_OTHER): Payer: Self-pay | Admitting: Emergency Medicine

## 2022-06-01 ENCOUNTER — Emergency Department (HOSPITAL_BASED_OUTPATIENT_CLINIC_OR_DEPARTMENT_OTHER)
Admission: EM | Admit: 2022-06-01 | Discharge: 2022-06-02 | Disposition: A | Discharge status: Home / Self Care | Payer: BC Managed Care – PPO | Attending: Emergency Medicine | Admitting: Emergency Medicine

## 2022-06-01 DIAGNOSIS — R1033 Periumbilical pain: Secondary | ICD-10-CM | POA: Diagnosis present

## 2022-06-01 DIAGNOSIS — N83202 Unspecified ovarian cyst, left side: Secondary | ICD-10-CM | POA: Insufficient documentation

## 2022-06-01 LAB — CBC
HCT: 41 % (ref 36.0–46.0)
Hemoglobin: 13.6 g/dL (ref 12.0–15.0)
MCH: 28.6 pg (ref 26.0–34.0)
MCHC: 33.2 g/dL (ref 30.0–36.0)
MCV: 86.3 fL (ref 80.0–100.0)
Platelets: 285 10*3/uL (ref 150–400)
RBC: 4.75 MIL/uL (ref 3.87–5.11)
RDW: 13.7 % (ref 11.5–15.5)
WBC: 8.1 10*3/uL (ref 4.0–10.5)
nRBC: 0 % (ref 0.0–0.2)

## 2022-06-01 LAB — COMPREHENSIVE METABOLIC PANEL
ALT: 13 U/L (ref 0–44)
AST: 15 U/L (ref 15–41)
Albumin: 4.2 g/dL (ref 3.5–5.0)
Alkaline Phosphatase: 53 U/L (ref 38–126)
Anion gap: 11 (ref 5–15)
BUN: 12 mg/dL (ref 6–20)
CO2: 20 mmol/L — ABNORMAL LOW (ref 22–32)
Calcium: 9.3 mg/dL (ref 8.9–10.3)
Chloride: 105 mmol/L (ref 98–111)
Creatinine, Ser: 0.71 mg/dL (ref 0.44–1.00)
GFR, Estimated: >60 mL/min (ref >60)
Glucose, Bld: 80 mg/dL (ref 70–99)
Potassium: 3.6 mmol/L (ref 3.5–5.1)
Sodium: 136 mmol/L (ref 135–145)
Total Bilirubin: 0.4 mg/dL (ref 0.3–1.2)
Total Protein: 7.8 g/dL (ref 6.5–8.1)

## 2022-06-01 LAB — LIPASE, BLOOD: Lipase: 15 U/L (ref 11–51)

## 2022-06-01 NOTE — ED Triage Notes (Signed)
Pt arrives to ED with c/o suprapubic abdominal pain that yesterday. Associated with nausea.

## 2022-06-01 NOTE — ED Provider Notes (Signed)
[facility] EMERGENCY Provider Note: Georgena Spurling, MD, FACEP  CSN: [id number] MRN: [id number] ARRIVAL: 06/01/22 at [place]: [id number]   CHIEF COMPLAINT  Abdominal Pain   HISTORY OF PRESENT ILLNESS  06/01/22 11:24 PM Allison [facility]ia is a 37 y.o. female with abdominal pain that began yesterday evening.  The pain is primarily periumbilical.  It is sharp in nature.  It comes and goes.  It is a 7 out of 10 at its worst but is not present at the current time.  It has been associated with nausea but no vomiting or diarrhea.  She was seen earlier today at an urgent care where pregnancy test was negative and a urinalysis was unremarkable.  She was sent here for CT scan of the abdomen and pelvis.   History reviewed. No pertinent past medical history.  History reviewed. No pertinent surgical history.  History reviewed. No pertinent family history.  Social History   Tobacco Use   Smoking status: Never   Smokeless tobacco: Never  Vaping Use   Vaping Use: Never used  Substance Use Topics   Alcohol use: Not Currently   Drug use: Never    Prior to Admission medications   Medication Sig Start Date End Date Taking? Authorizing Provider  ondansetron (ZOFRAN-ODT) 8 MG disintegrating tablet Take 1 tablet (8 mg total) by mouth every 8 (eight) hours as needed for nausea or vomiting. 06/02/22  Yes Sharonica Kraszewski, MD  naproxen (NAPROSYN) 500 MG tablet Take 1 tablet (500 mg total) by mouth 2 (two) times daily with a meal. 01/26/22   [name], MD    Allergies Patient has no known allergies.   REVIEW OF SYSTEMS  Negative except as noted here or in the History of Present Illness.   PHYSICAL EXAMINATION  Initial Vital Signs Blood pressure (!) 134/94, pulse 69, temperature 98 F (36.7 C), temperature source Oral, resp. rate 18, height 5\' 5"  (1.651 m), weight 127 kg, SpO2 99 %.  Examination General: Well-developed, well-nourished female in no acute distress; appearance consistent with age of  record HENT: normocephalic; atraumatic Eyes: Normal appearance Neck: supple Heart: regular rate and rhythm Lungs: clear to auscultation bilaterally Abdomen: soft; nondistended; nontender; bowel sounds present Extremities: No deformity; full range of motion; pulses normal Neurologic: Awake, alert and oriented; motor function intact in all extremities and symmetric; no facial droop Skin: Warm and dry Psychiatric: Normal mood and affect   RESULTS  Summary of this visit's results, reviewed and interpreted by myself:   EKG Interpretation  Date/Time:    Ventricular Rate:    PR Interval:    QRS Duration:   QT Interval:    QTC Calculation:   R Axis:     Text Interpretation:         Laboratory Studies: Results for orders placed or performed during the hospital encounter of 06/01/22 (from the past 24 hour(s))  Lipase, blood     Status: None   Collection Time: 06/01/22  6:30 PM  Result Value Ref Range   Lipase 15 11 - 51 U/L  Comprehensive metabolic panel     Status: Abnormal   Collection Time: 06/01/22  6:30 PM  Result Value Ref Range   Sodium 136 135 - 145 mmol/L   Potassium 3.6 3.5 - 5.1 mmol/L   Chloride 105 98 - 111 mmol/L   CO2 20 (L) 22 - 32 mmol/L   Glucose, Bld 80 70 - 99 mg/dL   BUN 12 6 - 20 mg/dL   Creatinine, Ser  0.71 0.44 - 1.00 mg/dL   Calcium 9.3 8.9 - 10.3 mg/dL   Total Protein 7.8 6.5 - 8.1 g/dL   Albumin 4.2 3.5 - 5.0 g/dL   AST 15 15 - 41 U/L   ALT 13 0 - 44 U/L   Alkaline Phosphatase 53 38 - 126 U/L   Total Bilirubin 0.4 0.3 - 1.2 mg/dL   GFR, Estimated >60 >60 mL/min   Anion gap 11 5 - 15  CBC     Status: None   Collection Time: 06/01/22  6:30 PM  Result Value Ref Range   WBC 8.1 4.0 - 10.5 K/uL   RBC 4.75 3.87 - 5.11 MIL/uL   Hemoglobin 13.6 12.0 - 15.0 g/dL   HCT 41.0 36.0 - 46.0 %   MCV 86.3 80.0 - 100.0 fL   MCH 28.6 26.0 - 34.0 pg   MCHC 33.2 30.0 - 36.0 g/dL   RDW 13.7 11.5 - 15.5 %   Platelets 285 150 - 400 K/uL   nRBC 0.0 0.0 - 0.2  %  hCG, quantitative, pregnancy     Status: None   Collection Time: 06/01/22 11:25 PM  Result Value Ref Range   hCG, Beta Chain, Quant, S <1 <5 mIU/mL   Imaging Studies: CT ABDOMEN PELVIS W CONTRAST  Result Date: 06/02/2022 CLINICAL DATA:  Suprapubic abdominal pain and nausea. EXAM: CT ABDOMEN AND PELVIS WITH CONTRAST TECHNIQUE: Multidetector CT imaging of the abdomen and pelvis was performed using the standard protocol following bolus administration of intravenous contrast. RADIATION DOSE REDUCTION: This exam was performed according to the departmental dose-optimization program which includes automated exposure control, adjustment of the mA and/or kV according to patient size and/or use of iterative reconstruction technique. CONTRAST:  120mL OMNIPAQUE IOHEXOL 300 MG/ML  SOLN COMPARISON:  None Available. FINDINGS: Lower chest: No acute abnormality. Hepatobiliary: No focal liver abnormality is seen. No gallstones, gallbladder wall thickening, or biliary dilatation. Pancreas: Unremarkable. No pancreatic ductal dilatation or surrounding inflammatory changes. Spleen: Normal in size without focal abnormality. Adrenals/Urinary Tract: Adrenal glands are unremarkable. Kidneys are normal in size, without renal calculi or hydronephrosis. Subcentimeter simple cysts are seen within the left kidney. Bladder is unremarkable. Stomach/Bowel: Stomach is within normal limits. Appendix appears normal. No evidence of bowel wall thickening, distention, or inflammatory changes. Vascular/Lymphatic: No significant vascular findings are present. No enlarged abdominal or pelvic lymph nodes. Reproductive: The uterus and right adnexa are unremarkable. A 2.5 cm diameter simple cyst is seen within the left adnexa. Other: No abdominal wall hernia or abnormality. No abdominopelvic ascites. Musculoskeletal: No acute or significant osseous findings. IMPRESSION: 1. 2.5 cm simple left adnexal cyst, likely ovarian in origin. No follow-up  imaging is recommended. This recommendation follows ACR consensus guidelines: White Paper of the ACR Incidental Findings Committee II on Adnexal Findings. J Am Coll Radiol 2013:10:675-681. 2. Multiple simple left renal cyst. No follow-up imaging is recommended. This recommendation follows ACR consensus guidelines: Management of the Incidental Renal Mass on CT: A White Paper of the ACR Incidental Findings Committee. J Am Coll Radiol [phone]. Electronically Signed   By: Virgina Norfolk M.D.   On: 06/02/2022 00:27    ED COURSE and MDM  Nursing notes, initial and subsequent vitals signs, including pulse oximetry, reviewed and interpreted by myself.  Vitals:   06/01/22 1821 06/01/22 1822 06/01/22 2244  BP:  (!) 155/102 (!) 134/94  Pulse:  74 69  Resp:  16 18  Temp:  98 F (36.7 C) 98 F (36.7 C)  TempSrc:  Temporal Oral  SpO2:  99% 99%  Weight: 127 kg    Height: 5\' 5"  (1.651 m)     Medications  iohexol (OMNIPAQUE) 300 MG/ML solution 100 mL (100 mLs Intravenous Contrast Given 06/02/22 0016)   12:40 AM The patient's workup is reassuring.  CT does show a left adnexal cyst, likely ovarian.  It is unclear if this is causing the patient's pain as she is not currently tender.  She could have a viral gastrointestinal illness causing her symptoms.  We will treat her nausea and have her return if symptoms worsen or change.   PROCEDURES  Procedures   ED DIAGNOSES     ICD-10-CM   1. Periumbilical abdominal pain  R10.33     2. Left ovarian cyst  N83.202          Shanon Rosser, MD 06/02/22 [phone]

## 2022-06-02 ENCOUNTER — Emergency Department (HOSPITAL_BASED_OUTPATIENT_CLINIC_OR_DEPARTMENT_OTHER): Payer: BC Managed Care – PPO

## 2022-06-02 DIAGNOSIS — N83202 Unspecified ovarian cyst, left side: Secondary | ICD-10-CM | POA: Diagnosis not present

## 2022-06-02 LAB — HCG, QUANTITATIVE, PREGNANCY: hCG, Beta Chain, Quant, S: <1 m[IU]/mL (ref <5)

## 2022-06-02 MED ORDER — ONDANSETRON 8 MG PO TBDP: 8.0000 mg | ORAL_TABLET | Freq: Three times a day (TID) | ORAL | 0 refills | Status: DC | PRN

## 2022-06-02 MED ORDER — IOHEXOL 300 MG/ML  SOLN
100.0000 mL | Freq: Once | INTRAMUSCULAR | Status: AC | PRN
  Administered 2022-06-02: 100 mL via INTRAVENOUS

## 2022-06-24 ENCOUNTER — Emergency Department (HOSPITAL_COMMUNITY)
Admission: EM | Admit: 2022-06-24 | Discharge: 2022-06-24 | Disposition: A | Discharge status: Home / Self Care | Payer: BC Managed Care – PPO | Attending: Emergency Medicine | Admitting: Emergency Medicine

## 2022-06-24 ENCOUNTER — Other Ambulatory Visit: Payer: Self-pay

## 2022-06-24 ENCOUNTER — Encounter (HOSPITAL_COMMUNITY): Payer: Self-pay

## 2022-06-24 DIAGNOSIS — R0789 Other chest pain: Secondary | ICD-10-CM

## 2022-06-24 DIAGNOSIS — F419 Anxiety disorder, unspecified: Secondary | ICD-10-CM | POA: Diagnosis not present

## 2022-06-24 DIAGNOSIS — R030 Elevated blood-pressure reading, without diagnosis of hypertension: Secondary | ICD-10-CM | POA: Diagnosis present

## 2022-06-24 HISTORY — DX: Anxiety disorder, unspecified: F41.9

## 2022-06-24 HISTORY — DX: Depression, unspecified: F32.A

## 2022-06-24 NOTE — ED Triage Notes (Signed)
Pt states that her BP has been elevated since 10 pm. Pt states that she is just stressed out.

## 2022-06-24 NOTE — ED Provider Notes (Signed)
[facility] Provider Note  CSN: [id number] Arrival date & time: 06/24/22 0407  Chief Complaint(s) Hypertension  HPI Allison Tapia is a 37 y.o. female with a past medical history listed below who presents to the emergency department for evaluation after noting elevated blood pressures while at work.  She states that she was involved in a stressful interaction with supervisors causing her to feel mild chest discomfort and lightheadedness.  Patient was evaluated noted to have elevated blood pressures with systolics greater than [id number] on multiple reads.  Patient reports that the chest pain was similar to prior stress related incidences.  Pain was nonradiating and nonexertional.  No associated shortness of breath.  No nausea or vomiting.  No recent fevers or infections.  Pain resolved after she emotionally improved.  Patient does not have a history of high blood pressure, high cholesterol.  She is a non-smoker.  The history is provided by the patient.    Past Medical History Past Medical History:  Diagnosis Date   Anxiety    Depression    There are no problems to display for this patient.  Home Medication(s) Prior to Admission medications   Medication Sig Start Date End Date Taking? Authorizing Provider  naproxen (NAPROSYN) 500 MG tablet Take 1 tablet (500 mg total) by mouth 2 (two) times daily with a meal. 01/26/22   [name], MD  ondansetron (ZOFRAN-ODT) 8 MG disintegrating tablet Take 1 tablet (8 mg total) by mouth every 8 (eight) hours as needed for nausea or vomiting. 06/02/22   Molpus, Jenny Reichmann, MD                                                                                                                                    Allergies Patient has no known allergies.  Review of Systems Review of Systems As noted in HPI  Physical Exam Vital Signs  I have reviewed the triage vital signs BP 135/88 (BP Location: Right Arm)   Pulse 68    Temp 98.4 F (36.9 C) (Oral)   Resp 13   Ht '5\' 5"'$  (1.651 m)   Wt 127 kg   SpO2 100%   BMI 46.59 kg/m   Physical Exam Vitals reviewed.  Constitutional:      General: She is not in acute distress.    Appearance: She is well-developed. She is not diaphoretic.  HENT:     Head: Normocephalic and atraumatic.     Nose: Nose normal.  Eyes:     General: No scleral icterus.       Right eye: No discharge.        Left eye: No discharge.     Conjunctiva/sclera: Conjunctivae normal.     Pupils: Pupils are equal, round, and reactive to light.  Cardiovascular:     Rate and Rhythm: Normal rate and regular rhythm.     Heart sounds: No murmur heard.  No friction rub. No gallop.  Pulmonary:     Effort: Pulmonary effort is normal. No respiratory distress.     Breath sounds: Normal breath sounds. No stridor. No rales.  Abdominal:     General: There is no distension.     Palpations: Abdomen is soft.     Tenderness: There is no abdominal tenderness.  Musculoskeletal:        General: No tenderness.     Cervical back: Normal range of motion and neck supple.  Skin:    General: Skin is warm and dry.     Findings: No erythema or rash.  Neurological:     Mental Status: She is alert and oriented to person, place, and time.     ED Results and Treatments Labs (all labs ordered are listed, but only abnormal results are displayed) Labs Reviewed - No data to display                                                                                                                       EKG  EKG Interpretation  Date/Time:  Wednesday June 24 2022 05:02:57 EST Ventricular Rate:  69 PR Interval:  177 QRS Duration: 95 QT Interval:  467 QTC Calculation: 501 R Axis:   66 Text Interpretation: Sinus rhythm Prolonged QT interval No old tracing to compare Confirmed by Addison Lank [phone]) on 06/24/2022 5:13:57 AM       Radiology No results found.  Medications Ordered in ED Medications - No  data to display                                                                                                                                   Procedures Procedures  (including critical care time)  Medical Decision Making / ED Course  Click here for ABCD2, HEART and other calculators  Medical Decision Making Amount and/or Complexity of Data Reviewed ECG/medicine tests: ordered and independent interpretation performed. Decision-making details documented in ED Course.    Chest discomfort appears to be stress related.  EKG without acute ischemic changes or evidence of pericarditis.  Pain now completely resolved and lungs clear to auscultation bilaterally.  I have low suspicion for infectious process, pneumothorax, PE, dissection.  Blood pressures are now 130s over 80s without intervention.      Final Clinical Impression(s) / ED Diagnoses Final diagnoses:  Elevated blood pressure reading  Episode of anxiety  Chest  discomfort   The patient appears reasonably screened and/or stabilized for discharge and I doubt any other medical condition or other [facility] requiring further screening, evaluation, or treatment in the ED at this time. I have discussed the findings, Dx and Tx plan with the patient/family who expressed understanding and agree(s) with the plan. Discharge instructions discussed at length. The patient/family was given strict return precautions who verbalized understanding of the instructions. No further questions at time of discharge.  Disposition: Discharge  Condition: Good  ED Discharge Orders     None         Follow Up: Primary care provider  Schedule an appointment as soon as possible for a visit  if you do not have a primary care physician, contact [facility] at [phone] for referral           This chart was dictated using voice recognition software.  Despite best efforts to proofread,  errors can occur which can change the documentation  meaning.    Fatima Blank, MD 06/24/22 [phone]

## 2022-06-26 ENCOUNTER — Emergency Department (HOSPITAL_BASED_OUTPATIENT_CLINIC_OR_DEPARTMENT_OTHER): Payer: BC Managed Care – PPO

## 2022-06-26 ENCOUNTER — Other Ambulatory Visit: Payer: Self-pay

## 2022-06-26 ENCOUNTER — Encounter (HOSPITAL_BASED_OUTPATIENT_CLINIC_OR_DEPARTMENT_OTHER): Payer: Self-pay | Admitting: Emergency Medicine

## 2022-06-26 ENCOUNTER — Emergency Department (HOSPITAL_BASED_OUTPATIENT_CLINIC_OR_DEPARTMENT_OTHER)
Admission: EM | Admit: 2022-06-26 | Discharge: 2022-06-26 | Disposition: A | Discharge status: Home / Self Care | Payer: BC Managed Care – PPO | Attending: Emergency Medicine | Admitting: Emergency Medicine

## 2022-06-26 DIAGNOSIS — N898 Other specified noninflammatory disorders of vagina: Secondary | ICD-10-CM | POA: Diagnosis not present

## 2022-06-26 DIAGNOSIS — B9689 Other specified bacterial agents as the cause of diseases classified elsewhere: Secondary | ICD-10-CM

## 2022-06-26 DIAGNOSIS — R1030 Lower abdominal pain, unspecified: Secondary | ICD-10-CM

## 2022-06-26 DIAGNOSIS — A599 Trichomoniasis, unspecified: Secondary | ICD-10-CM

## 2022-06-26 DIAGNOSIS — R110 Nausea: Secondary | ICD-10-CM | POA: Insufficient documentation

## 2022-06-26 DIAGNOSIS — R1031 Right lower quadrant pain: Secondary | ICD-10-CM | POA: Diagnosis present

## 2022-06-26 LAB — CBC WITH DIFFERENTIAL/PLATELET
Abs Immature Granulocytes: 0.02 10*3/uL (ref 0.00–0.07)
Basophils Absolute: 0.1 10*3/uL (ref 0.0–0.1)
Basophils Relative: 1 %
Eosinophils Absolute: 0.1 10*3/uL (ref 0.0–0.5)
Eosinophils Relative: 1 %
HCT: 39.4 % (ref 36.0–46.0)
Hemoglobin: 12.8 g/dL (ref 12.0–15.0)
Immature Granulocytes: 0 %
Lymphocytes Relative: 17 %
Lymphs Abs: 1.2 10*3/uL (ref 0.7–4.0)
MCH: 28.3 pg (ref 26.0–34.0)
MCHC: 32.5 g/dL (ref 30.0–36.0)
MCV: 87.2 fL (ref 80.0–100.0)
Monocytes Absolute: 0.5 10*3/uL (ref 0.1–1.0)
Monocytes Relative: 8 %
Neutro Abs: 5.4 10*3/uL (ref 1.7–7.7)
Neutrophils Relative %: 73 %
Platelets: 277 10*3/uL (ref 150–400)
RBC: 4.52 MIL/uL (ref 3.87–5.11)
RDW: 14.1 % (ref 11.5–15.5)
WBC: 7.2 10*3/uL (ref 4.0–10.5)
nRBC: 0 % (ref 0.0–0.2)

## 2022-06-26 LAB — URINALYSIS, MICROSCOPIC (REFLEX)

## 2022-06-26 LAB — COMPREHENSIVE METABOLIC PANEL
ALT: 15 U/L (ref 0–44)
AST: 17 U/L (ref 15–41)
Albumin: 3.3 g/dL — ABNORMAL LOW (ref 3.5–5.0)
Alkaline Phosphatase: 55 U/L (ref 38–126)
Anion gap: 4 — ABNORMAL LOW (ref 5–15)
BUN: 12 mg/dL (ref 6–20)
CO2: 25 mmol/L (ref 22–32)
Calcium: 8.2 mg/dL — ABNORMAL LOW (ref 8.9–10.3)
Chloride: 108 mmol/L (ref 98–111)
Creatinine, Ser: 0.68 mg/dL (ref 0.44–1.00)
GFR, Estimated: >60 mL/min (ref >60)
Glucose, Bld: 91 mg/dL (ref 70–99)
Potassium: 3.9 mmol/L (ref 3.5–5.1)
Sodium: 137 mmol/L (ref 135–145)
Total Bilirubin: 0.7 mg/dL (ref 0.3–1.2)
Total Protein: 7.2 g/dL (ref 6.5–8.1)

## 2022-06-26 LAB — HCG, SERUM, QUALITATIVE: Preg, Serum: NEGATIVE

## 2022-06-26 LAB — URINALYSIS, ROUTINE W REFLEX MICROSCOPIC
Bilirubin Urine: NEGATIVE
Glucose, UA: NEGATIVE mg/dL
Ketones, ur: NEGATIVE mg/dL
Leukocytes,Ua: NEGATIVE
Nitrite: NEGATIVE
Protein, ur: NEGATIVE mg/dL
Specific Gravity, Urine: 1.025 (ref 1.005–1.030)
pH: 6.5 (ref 5.0–8.0)

## 2022-06-26 LAB — WET PREP, GENITAL
Sperm: NONE SEEN
WBC, Wet Prep HPF POC: <10 (ref <10)
Yeast Wet Prep HPF POC: NONE SEEN

## 2022-06-26 LAB — LIPASE, BLOOD: Lipase: 26 U/L (ref 11–51)

## 2022-06-26 MED ORDER — ONDANSETRON HCL 4 MG/2ML IJ SOLN
4.0000 mg | Freq: Once | INTRAMUSCULAR | Status: AC
  Administered 2022-06-26: 4 mg via INTRAVENOUS
  Filled 2022-06-26: qty 2

## 2022-06-26 MED ORDER — MORPHINE SULFATE (PF) 4 MG/ML IV SOLN
4.0000 mg | Freq: Once | INTRAVENOUS | Status: AC
  Administered 2022-06-26: 4 mg via INTRAVENOUS
  Filled 2022-06-26: qty 1

## 2022-06-26 MED ORDER — METRONIDAZOLE 500 MG PO TABS: 500.0000 mg | ORAL_TABLET | Freq: Two times a day (BID) | ORAL | 0 refills | Status: DC

## 2022-06-26 NOTE — Discharge Instructions (Addendum)
Thank you for coming to [facility] Emergency Department. You were seen for abdominal pain and vaginal discharge. We did an exam, labs, and imaging, and these showed positive tests for trichomoniasis, bacterial vaginosis both of which are treated with flagyl 500 mg twice per day for 7 days. Please do not drink alcohol when you take this medication. You also had a growth in the uterus which can be followed as an outpatient with a gynecologist. You can call [facility] at [phone] to schedule an appointment as soon as possible within 1-2 weeks.  Please follow up your gonorrhea/chlamydia results in MyChart in the next 1-2 days.  Do not hesitate to return to the ED or call 911 if you experience: -Worsening symptoms -Lightheadedness, passing out -Fevers/chills -Anything else that concerns you

## 2022-06-26 NOTE — ED Notes (Signed)
ED Provider at bedside. 

## 2022-06-26 NOTE — ED Provider Notes (Signed)
5:53 PM Assumed care of patient from off-going team. For more details, please see note from same day.  In brief, this is a 37 y.o. female w/ no significant PMH who p/w abd pain  Had abdominal pain a few weeks ago, got CT that showed ovarian cysts Last couple days new belly pain, feels similar to last time Upreg neg No CMT on pelvic, some discharge Likely cyst rupture, but r/o torsion  Plan/Dispo at time of sign-out & ED Course since sign-out: '[ ]'$  urine '[ ]'$  pelvic ultrasound r/o torsion  BP (!) 141/96   Pulse 69   Temp 98.4 F (36.9 C) (Oral)   Resp 14   SpO2 100%    ED Course:   Clinical Course as of 06/26/22 1826  Fri Jun 26, 2022  1714 Clue Cells Wet Prep HPF POC(!): PRESENT [HN]  1714 Trich, Wet Prep(!): PRESENT [HN]  1715 US PELVIC COMPLETE W TRANSVAGINAL AND TORSION R/O FINDINGS: Uterus  Measurements: 9.6 x 5.4 x 4.9 = volume: 134 mL. There is a small echogenic area towards the lower uterine segment measuring 17 x 6 x 17 mm. Prominent periuterine vessels.  Endometrium  Thickness: 2 mm. Echogenic area towards the lower uterine segment is along the margin of the endometrium.  Right ovary  Measurements: 3.8 x 1.7 x 2.7 = volume: 9.0 mL. Normal appearance/no adnexal mass.  Left ovary  Measurements: 4.7 x 2.3 x 3.0 = volume: 17.5 mL. Normal appearance/no adnexal mass.  Ovaries are best seen transabdominal.  Other findings  No abnormal free fluid.  IMPRESSION: No free fluid.  17 x 6 x 17 mm echogenic area along the lower uterine segment along the margin of the course of the endometrium. It is uncertain on these images if this is endometrial or adjacent. This would have a broad differential. Recommend correlation to any prior imaging. If further radiologic evaluation is needed, history sonogram versus female pelvic MRI as clinically directed.   [HN]  1743 Urinalysis, Routine w reflex microscopic -Urine, Clean Catch(!) Neg UTI [HN]  1743 Treatment  for trichomoniasis as well as BV is metronidazole 500 mg PO BID x 7 days.  [HN]  3 Called radiologist Dr. Lyndel Safe to discuss torsion r/o, he states both ovaries have normal dopplers.  [HN]  [id number] Informed patient her labs/imaging findings including uterine growth which will need o/p f/u. D/w patient who states she doesn' thave a specific concern for G/C. With +trich/BV, will treat w/ flagyl and instructed patient to f/u on MyChart about her G/C results. Patient doesn't currently have a OB/Gyn so put info in DC paperwork and instructed her to call on Monday morning to f/u. DC w/ discharge instructions/return precautions. All questions answered to patient's satisfaction.   [HN]    Clinical Course User Index [HN] Audley Hose, MD    Dispo: .DC w/ discharge instructions/return precautions. All questions answered to patient's satisfaction.  Given flagyl rx, OB/gyn referral.  ------------------------------- Cindee Lame, MD Emergency Medicine  This note was created using dictation software, which may contain spelling or grammatical errors.    Audley Hose, MD 06/26/22 [phone]

## 2022-06-26 NOTE — ED Triage Notes (Signed)
Lower abd pain since last night , hx ovarian cyst , feels same today .

## 2022-06-26 NOTE — ED Notes (Signed)
Pt is ambulating to the bathroom to void

## 2022-06-26 NOTE — ED Provider Notes (Signed)
Allison Tapia Provider Note   CSN: [id number] Arrival date & time: 06/26/22  1242     History  Chief Complaint  Patient presents with   Abdominal Pain    lower    Allison Tapia is a 37 y.o. female.  The history is provided by the patient and medical records. No language interpreter was used.  Abdominal Pain Pain location:  RLQ, LLQ and suprapubic Pain quality: aching, cramping and dull   Pain radiates to:  Does not radiate Pain severity:  Severe Onset quality:  Gradual Duration:  3 days Timing:  Constant Progression:  Waxing and waning Chronicity:  Recurrent Context: not trauma   Relieved by:  Nothing Worsened by:  Nothing Associated symptoms: nausea and vaginal bleeding   Associated symptoms: no chest pain, no chills, no constipation, no cough, no diarrhea, no dysuria, no fatigue, no fever, no shortness of breath, no vaginal discharge and no vomiting   Risk factors: has not had multiple surgeries        Home Medications Prior to Admission medications   Medication Sig Start Date End Date Taking? Authorizing Provider  naproxen (NAPROSYN) 500 MG tablet Take 1 tablet (500 mg total) by mouth 2 (two) times daily with a meal. 01/26/22   [name], MD  ondansetron (ZOFRAN-ODT) 8 MG disintegrating tablet Take 1 tablet (8 mg total) by mouth every 8 (eight) hours as needed for nausea or vomiting. 06/02/22   Molpus, Jenny Reichmann, MD      Allergies    Patient has no known allergies.    Review of Systems   Review of Systems  Constitutional:  Negative for chills, fatigue and fever.  HENT:  Negative for congestion.   Respiratory:  Negative for cough and shortness of breath.   Cardiovascular:  Negative for chest pain, palpitations and leg swelling.  Gastrointestinal:  Positive for abdominal pain and nausea. Negative for abdominal distention, constipation, diarrhea and vomiting.  Genitourinary:  Positive for pelvic pain and vaginal bleeding.  Negative for dysuria, flank pain, frequency, vaginal discharge and vaginal pain.  Musculoskeletal:  Negative for back pain, neck pain and neck stiffness.  Skin:  Negative for rash and wound.  Neurological:  Negative for headaches.  Psychiatric/Behavioral:  Negative for agitation.     Physical Exam Updated Vital Signs BP (!) 148/89 (BP Location: Left Arm)   Pulse 79   Temp 98.3 F (36.8 C) (Oral)   Resp 17   SpO2 100%  Physical Exam Vitals and nursing note reviewed. Exam conducted with a chaperone present.  Constitutional:      General: She is not in acute distress.    Appearance: She is well-developed. She is not ill-appearing, toxic-appearing or diaphoretic.  HENT:     Head: Normocephalic and atraumatic.     Right Ear: External ear normal.     Left Ear: External ear normal.     Nose: Nose normal.     Mouth/Throat:     Mouth: Mucous membranes are moist.     Pharynx: No oropharyngeal exudate.  Eyes:     Conjunctiva/sclera: Conjunctivae normal.     Pupils: Pupils are equal, round, and reactive to light.  Cardiovascular:     Rate and Rhythm: Normal rate.     Heart sounds: No murmur heard. Pulmonary:     Effort: No respiratory distress.     Breath sounds: No stridor. No wheezing, rhonchi or rales.  Chest:     Chest wall: No tenderness.  Abdominal:     General: Abdomen is flat. Bowel sounds are normal. There is no distension.     Tenderness: There is abdominal tenderness in the right lower quadrant, suprapubic area and left lower quadrant. There is no right CVA tenderness, left CVA tenderness, guarding or rebound.  Genitourinary:    General: Normal vulva.     Labia:        Right: No tenderness.        Left: No tenderness.      Vagina: Vaginal discharge present.     Cervix: Discharge present. No cervical motion tenderness, erythema or cervical bleeding.     Uterus: Not tender.      Adnexa:        Right: No tenderness or fullness.         Left: No tenderness or fullness.     Musculoskeletal:     Cervical back: Normal range of motion and neck supple.  Skin:    General: Skin is warm.     Findings: No erythema or rash.  Neurological:     General: No focal deficit present.     Mental Status: She is alert and oriented to person, place, and time.     Motor: No abnormal muscle tone.     Coordination: Coordination normal.     Deep Tendon Reflexes: Reflexes are normal and symmetric.     ED Results / Procedures / Treatments   Labs (all labs ordered are listed, but only abnormal results are displayed) Labs Reviewed  WET PREP, GENITAL - Abnormal; Notable for the following components:      Result Value   Trich, Wet Prep PRESENT (*)    Clue Cells Wet Prep HPF POC PRESENT (*)    All other components within normal limits  COMPREHENSIVE METABOLIC PANEL - Abnormal; Notable for the following components:   Calcium 8.2 (*)    Albumin 3.3 (*)    Anion gap 4 (*)    All other components within normal limits  URINE CULTURE  CBC WITH DIFFERENTIAL/PLATELET  LIPASE, BLOOD  HCG, SERUM, QUALITATIVE  URINALYSIS, ROUTINE W REFLEX MICROSCOPIC  GC/CHLAMYDIA PROBE AMP ([place]) NOT AT [facility]    EKG None  Radiology No results found.  Procedures Procedures    Medications Ordered in ED Medications  morphine (PF) 4 MG/ML injection 4 mg (4 mg Intravenous Given 06/26/22 1359)  ondansetron (ZOFRAN) injection 4 mg (4 mg Intravenous Given 06/26/22 1358)    ED Course/ Medical Decision Making/ A&P                             Medical Decision Making   Allison Tapia is a 37 y.o. female with a past medical history significant anxiety and depression as well as previous ovarian cyst presents with lower abdominal pain for the last 2 days.  She reports that for the last few days she has had lower abdominal pain that is moderate to severe.  She reports it is worse with palpation and movement.  She is having normal bowel movements and denies any dysuria.  She does report some  urinary frequency but otherwise denies any abdominal trauma.  She reports some mild vaginal bleeding but denies discharge initially.  She reports she has an implant and does not think she is pregnant.  She reportedly was seen for abdominal pain several weeks ago and had a CT scan that did not show appendicitis and did show the cyst.  She otherwise denies any fevers, chills, chest pain, shortness breath, or cough.  Denies any viral symptoms.  On exam, abdomen is tender in the lower abdomen and pelvis area.  Pelvic exam was performed with moderate discharge but no cervical motion tenderness or acute tenderness.  She did have the continued soreness and pain in her groin.  No bleeding seen on my exam.  Bowel sounds were appreciated.  Flanks nontender.  Lungs clear.  Patient otherwise well-appearing.  Will get a pregnancy test first.  Due to the pain in the lower abdomen with known cyst, I do feel she needs ultrasound to rule out ovarian torsion or other ovarian pathology.  Will wait for pregnancy test to order the correct ultrasound but she will need this.  Will get GC/chlamydia and wet prep sent.  Will get labs.  Will give her some medication to help with symptoms.  I clinically suspect this is ovarian cyst pain.  Anticipate follow-up on the ultrasound and labs.  Anticipate discharge if workup is reassuring with outpatient gynecologic follow-up.  Patient is not pregnant, ultrasound ordered.  Care transferred to oncoming team to await results of ultrasound and reassessment.  Anticipate discharge.         Final Clinical Impression(s) / ED Diagnoses Final diagnoses:  Lower abdominal pain    Clinical Impression: 1. Lower abdominal pain     Disposition: Admit  This note was prepared with assistance of Dragon voice recognition software. Occasional wrong-word or sound-a-like substitutions may have occurred due to the inherent limitations of voice recognition software.     Johnatha Zeidman, Gwenyth Allegra, MD 06/26/22 [phone]

## 2022-06-28 LAB — URINE CULTURE

## 2022-06-29 LAB — GC/CHLAMYDIA PROBE AMP (~~LOC~~) NOT AT ARMC
Chlamydia: NEGATIVE
Comment: NEGATIVE
Comment: NORMAL
Neisseria Gonorrhea: NEGATIVE

## 2022-12-26 ENCOUNTER — Ambulatory Visit (HOSPITAL_COMMUNITY)
Admission: EM | Admit: 2022-12-26 | Discharge: 2022-12-27 | Disposition: A | Discharge status: Other Acute Inpatient Hospital | Payer: BC Managed Care – PPO | Attending: Psychiatry | Admitting: Psychiatry

## 2022-12-26 ENCOUNTER — Other Ambulatory Visit: Payer: Self-pay

## 2022-12-26 DIAGNOSIS — Z79899 Other long term (current) drug therapy: Secondary | ICD-10-CM | POA: Diagnosis not present

## 2022-12-26 DIAGNOSIS — I10 Essential (primary) hypertension: Secondary | ICD-10-CM | POA: Diagnosis not present

## 2022-12-26 DIAGNOSIS — F332 Major depressive disorder, recurrent severe without psychotic features: Secondary | ICD-10-CM | POA: Insufficient documentation

## 2022-12-26 LAB — CBC WITH DIFFERENTIAL/PLATELET
Abs Immature Granulocytes: 0.03 10*3/uL (ref 0.00–0.07)
Basophils Absolute: 0.1 10*3/uL (ref 0.0–0.1)
Basophils Relative: 1 %
Eosinophils Absolute: 0.1 10*3/uL (ref 0.0–0.5)
Eosinophils Relative: 1 %
HCT: 41.5 % (ref 36.0–46.0)
Hemoglobin: 13.6 g/dL (ref 12.0–15.0)
Immature Granulocytes: 0 %
Lymphocytes Relative: 18 %
Lymphs Abs: 1.3 10*3/uL (ref 0.7–4.0)
MCH: 28 pg (ref 26.0–34.0)
MCHC: 32.8 g/dL (ref 30.0–36.0)
MCV: 85.6 fL (ref 80.0–100.0)
Monocytes Absolute: 0.6 10*3/uL (ref 0.1–1.0)
Monocytes Relative: 9 %
Neutro Abs: 5 10*3/uL (ref 1.7–7.7)
Neutrophils Relative %: 71 %
Platelets: 281 10*3/uL (ref 150–400)
RBC: 4.85 MIL/uL (ref 3.87–5.11)
RDW: 13.2 % (ref 11.5–15.5)
WBC: 7 10*3/uL (ref 4.0–10.5)
nRBC: 0 % (ref 0.0–0.2)

## 2022-12-26 LAB — POCT URINE DRUG SCREEN - MANUAL ENTRY (I-SCREEN)
POC Amphetamine UR: NOT DETECTED
POC Buprenorphine (BUP): NOT DETECTED
POC Cocaine UR: NOT DETECTED
POC Marijuana UR: NOT DETECTED
POC Methadone UR: NOT DETECTED
POC Methamphetamine UR: NOT DETECTED
POC Morphine: NOT DETECTED
POC Oxazepam (BZO): NOT DETECTED
POC Oxycodone UR: NOT DETECTED
POC Secobarbital (BAR): NOT DETECTED

## 2022-12-26 LAB — URINALYSIS, COMPLETE (UACMP) WITH MICROSCOPIC
Bacteria, UA: NONE SEEN
Bilirubin Urine: NEGATIVE
Glucose, UA: NEGATIVE mg/dL
Ketones, ur: 5 mg/dL — AB
Leukocytes,Ua: NEGATIVE
Nitrite: NEGATIVE
Protein, ur: NEGATIVE mg/dL
Specific Gravity, Urine: 1.026 (ref 1.005–1.030)
pH: 5 (ref 5.0–8.0)

## 2022-12-26 LAB — COMPREHENSIVE METABOLIC PANEL
ALT: 17 U/L (ref 0–44)
AST: 20 U/L (ref 15–41)
Albumin: 3.6 g/dL (ref 3.5–5.0)
Alkaline Phosphatase: 48 U/L (ref 38–126)
Anion gap: 10 (ref 5–15)
BUN: 7 mg/dL (ref 6–20)
CO2: 24 mmol/L (ref 22–32)
Calcium: 9 mg/dL (ref 8.9–10.3)
Chloride: 103 mmol/L (ref 98–111)
Creatinine, Ser: 0.78 mg/dL (ref 0.44–1.00)
GFR, Estimated: >60 mL/min (ref >60)
Glucose, Bld: 82 mg/dL (ref 70–99)
Potassium: 3.6 mmol/L (ref 3.5–5.1)
Sodium: 137 mmol/L (ref 135–145)
Total Bilirubin: 0.5 mg/dL (ref 0.3–1.2)
Total Protein: 6.8 g/dL (ref 6.5–8.1)

## 2022-12-26 LAB — TSH: TSH: 2.598 u[IU]/mL (ref 0.350–4.500)

## 2022-12-26 LAB — HEMOGLOBIN A1C
Hgb A1c MFr Bld: 5.5 % (ref 4.8–5.6)
Mean Plasma Glucose: 111.15 mg/dL

## 2022-12-26 LAB — LIPID PANEL
Cholesterol: 205 mg/dL — ABNORMAL HIGH (ref 0–200)
HDL: 36 mg/dL — ABNORMAL LOW (ref >40)
LDL Cholesterol: 149 mg/dL — ABNORMAL HIGH (ref 0–99)
Total CHOL/HDL Ratio: 5.7 RATIO
Triglycerides: 100 mg/dL (ref <150)
VLDL: 20 mg/dL (ref 0–40)

## 2022-12-26 LAB — POC URINE PREG, ED: Preg Test, Ur: NEGATIVE

## 2022-12-26 LAB — MAGNESIUM: Magnesium: 1.8 mg/dL (ref 1.7–2.4)

## 2022-12-26 LAB — ETHANOL: Alcohol, Ethyl (B): <10 mg/dL (ref <10)

## 2022-12-26 MED ORDER — ACETAMINOPHEN 325 MG PO TABS: 650.0000 mg | ORAL_TABLET | Freq: Four times a day (QID) | ORAL | Status: DC | PRN

## 2022-12-26 MED ORDER — ESCITALOPRAM OXALATE 10 MG PO TABS: 20.0000 mg | ORAL_TABLET | Freq: Every morning | ORAL | Status: DC

## 2022-12-26 MED ORDER — TRAZODONE HCL 50 MG PO TABS: 50.0000 mg | ORAL_TABLET | Freq: Every evening | ORAL | Status: DC | PRN

## 2022-12-26 MED ORDER — BUSPIRONE HCL 5 MG PO TABS: 5.0000 mg | ORAL_TABLET | Freq: Two times a day (BID) | ORAL | Status: DC

## 2022-12-26 MED ORDER — HYDROXYZINE HCL 25 MG PO TABS: 25.0000 mg | ORAL_TABLET | Freq: Three times a day (TID) | ORAL | Status: DC | PRN

## 2022-12-26 MED ORDER — ALUM & MAG HYDROXIDE-SIMETH 200-200-20 MG/5ML PO SUSP: 30.0000 mL | ORAL | Status: DC | PRN

## 2022-12-26 MED ORDER — AMLODIPINE BESYLATE 10 MG PO TABS: 10.0000 mg | ORAL_TABLET | Freq: Every day | ORAL | Status: DC

## 2022-12-26 MED ORDER — MAGNESIUM HYDROXIDE 400 MG/5ML PO SUSP: 30.0000 mL | Freq: Every day | ORAL | Status: DC | PRN

## 2022-12-26 NOTE — ED Notes (Signed)
Patient is sleeping. Respirations equal and unlabored, skin warm and dry. No change in assessment or acuity. Routine safety checks conducted according to facility protocol. Will continue to monitor for safety.   
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
## 2022-12-26 NOTE — Progress Notes (Signed)
   12/26/22 1557  [facility] Triage Screening (Walk-ins at [facility] only)  How Did You Hear About [place]? Family/Friend  What Is the Reason for Your Visit/Call Today? Pt presents to [facility] voluntarily accompanied by her brother. Pt reports that she is tired and wants to feel better. Pt also reports worsening anxiety and depression. Pt states, "today is my grandmother and friends birthday, they are no longer here any more and I feel sad today". Pt reports she has been sleeping alot. Pt reports that she has a Theme park manager doctor and sees a therapist. Pt still reports that "she cannot get it together". Pt is requesting to increase her medication because she feels like her current dosage is not helping her. Pt also requests additional therapy resources. Pt denies sunstance use, SI, HI, and AVH.  How Long Has This Been Causing You Problems? 1 wk - 1 month  Have You Recently Had Any Thoughts About Hurting Yourself? No  Are You Planning to Commit Suicide/Harm Yourself At This time? No  Have you Recently Had Thoughts About Hurting Someone Karolee Ohs? No  Are You Planning To Harm Someone At This Time? No  Are you currently experiencing any auditory, visual or other hallucinations? No  Have You Used Any Alcohol or Drugs in the Past 24 Hours? No  Do you have any current medical co-morbidities that require immediate attention? No  Clinician description of patient physical appearance/behavior: tearful, cooperative, anxious  What Do You Feel Would Help You the Most Today? Stress Management;Social Support  If access to [facility] was not available, would you have sought care in the Emergency Department? No  Determination of Need Routine (7 days)  Options For Referral Outpatient Therapy

## 2022-12-26 NOTE — ED Notes (Signed)
Patient admitted to obs unit. Patient presents with flat affect and states feeling very depressed but is denying SI,HI, and A/V/H with no plan or intent. Patient tearful at times and provided with emotional support. Patient oriented to unit and provided with meal. Patient A&Ox4, Independent with ADLS, does report bilateral shin issues but does not requires any assistive devices. Patient wrote down all needed phone numbers from cellphone before heading to unit and verbalizes all understanding. Patient in no current distress.

## 2022-12-26 NOTE — ED Provider Notes (Cosign Needed Addendum)
[facility] Medical Screening Exam   Date: 12/26/22 Patient Name: Allison Tapia MRN: [id number] Chief Complaint: increased depression  Diagnoses:  Final diagnoses:  Severe episode of recurrent major depressive disorder, without psychotic features [facility])    HPI: patient presented to [facility] as a walk in  accompanied by her friend and brother with complaints of increased depression.  Allison Tapia, 37 y.o., female patient seen face to face by this provider; and chart reviewed on 12/26/22.  Patient reports that she has a past psychiatric history of MDD and anxiety.  Chart review is limited.  Patient reports she is prescribed Lexapro 10 mg daily, BuSpar 5 mg daily, and amlodipine for blood pressure.  [facility]  She gets psychiatric medication management at [facility] psychiatry.  She reports compliance with medications.  She denies any previous suicide attempts.  She denies any previous inpatient psychiatric admissions.  She denies any substance use.  She works full-time as a Electrical engineer on third shift.  She lives with her 58-year-old daughter whom is being taking care of by her mother at this time.  On evaluation Allison Tapia reports she presents today at the request of her mother.  States her mother is concerned that she may hurt herself.   On evaluation patient observed sitting in the assessment room with her head on the table, she is in no acute distress.  She is disheveled and makes fleeting eye contact.  Her speech is clear, coherent, at a normal rate but decreased tone.  She is alert/oriented x 4, cooperative, and attentive.  She endorses an increase in her depression and anxiety over the past month.  She does not identify any specific stressor/triggers for her depression other than she has been working a night shift position for a while now and believes it is taking a toll on her.  In addition today is her grandmother and friend's birthday who have passed away.   She endorses feelings of sadness, tearfulness, decreased focus, decreased motivation, decreased appetite and an increase in her sleep.  She states she sleeps more often than she should.  She has a depressed affect.  She is tearful throughout assessment.  She vaguely denies suicidal ideations and states, "I am just so tired, I just do not know how to keep down".  She does identify her 35-year-old daughter as her protective factor.  She vaguely contracts for safety.  I do not believe she is forthcoming with her suicidal ideations.  She denies homicidal ideations.  She denies auditory/visual hallucinations.  She does not appear manic, psychotic, or paranoid.  She does not appear to be responding to internal/external stimuli.  Collateral [place] [phone] (patient's mother).  States patient has texted her, her sister and other family members yesterday telling them that she was sorry and that she loved them.  States she had told her sister that she was going to kill herself.  Patient's mother believes that patient is a danger to herself.  States she is so depressed that she cannot function.  She is having to take care of her 21-year-old daughter because patient is unable to do so.  States if patient is discharged she will go and perform an involuntary commitment.  Brought phone to patient and called patient's mother, patient's mother was able to convince the patient to stay voluntarily for inpatient psychiatric admission.    Total Time spent with patient: 30 minutes  Musculoskeletal  Strength & Muscle Tone: within normal limits  Gait & Station: normal Patient leans: N/A  Psychiatric Specialty Exam  Presentation General Appearance:  Disheveled  Eye Contact: Fleeting  Speech: Clear and Coherent; Normal Rate  Speech Volume: Decreased  Handedness: Right   Mood and Affect  Mood: Depressed  Affect: Congruent   Thought Process  Thought Processes: Coherent  Descriptions of  Associations:Intact  Orientation:Full (Time, Place and Person)  Thought Content:Logical    Hallucinations:Hallucinations: None  Ideas of Reference:None  Suicidal Thoughts:Suicidal Thoughts: Yes, Passive SI Passive Intent and/or Plan: Without Intent; Without Plan; Without Means to Carry Out  Homicidal Thoughts:Homicidal Thoughts: No   Sensorium  Memory: Immediate Good; Recent Good; Remote Good  Judgment: Fair  Insight: Fair   Art therapist  Concentration: Good  Attention Span: Good  Recall: Good  Fund of Knowledge: Good  Language: Good   Psychomotor Activity  Psychomotor Activity: Psychomotor Activity: Normal   Assets  Assets: Communication Skills; Desire for Improvement; Physical Health; Resilience; Financial Resources/Insurance; Housing   Sleep  Sleep: Sleep: Poor Number of Hours of Sleep: 0 (unable to state amount other tha n "too much")   Nutritional Assessment (For OBS and FBC admissions only) Has the patient had a weight loss or gain of 10 pounds or more in the last 3 months?: No Has the patient had a decrease in food intake/or appetite?: Yes Does the patient have dental problems?: No Does the patient have eating habits or behaviors that may be indicators of an eating disorder including binging or inducing vomiting?: No Has the patient recently lost weight without trying?: 2.0 Has the patient been eating poorly because of a decreased appetite?: 1 Malnutrition Screening Tool Score: 3    Physical Exam Vitals and nursing note reviewed.  Constitutional:      General: She is not in acute distress.    Appearance: Normal appearance. She is not ill-appearing.  HENT:     Head: Normocephalic.  Eyes:     General:        Right eye: No discharge.        Left eye: No discharge.  Cardiovascular:     Rate and Rhythm: Normal rate.  Pulmonary:     Effort: Pulmonary effort is normal.  Musculoskeletal:        General: Normal range of  motion.     Cervical back: Normal range of motion.  Skin:    General: Skin is warm and dry.  Neurological:     Mental Status: She is alert and oriented to person, place, and time.  Psychiatric:        Attention and Perception: Attention and perception normal.        Mood and Affect: Mood is anxious and depressed. Affect is tearful.        Speech: Speech normal.        Behavior: Behavior is cooperative.        Thought Content: Thought content includes suicidal ideation. Thought content does not include suicidal plan.        Cognition and Memory: Cognition normal.        Judgment: Judgment normal.    Review of Systems  Constitutional: Negative.   HENT: Negative.    Eyes: Negative.   Respiratory: Negative.    Cardiovascular: Negative.   Genitourinary: Negative.   Musculoskeletal:        Shin split pain   Skin: Negative.   Neurological: Negative.   Psychiatric/Behavioral:  Positive for depression and suicidal ideas. The patient is nervous/anxious.  Blood pressure (!) 128/90, pulse 77, temperature 98.6 F (37 C), temperature source Oral, resp. rate 19, SpO2 100%. There is no height or weight on file to calculate BMI.  Past Psychiatric History: MDD and GAD  Is the patient at risk to self? Yes  Has the patient been a risk to self in the past 6 months? No .    Has the patient been a risk to self within the distant past? No   Is the patient a risk to others? No   Has the patient been a risk to others in the past 6 months? No   Has the patient been a risk to others within the distant past? No   Past Medical History:  Past Medical History:  Diagnosis Date   Anxiety    Depression   Shin splints   Family History: Denies any psychiatric family history  Social History:  Single Has 54-year-old daughter Lives alone-mother is taking care of her child at this time Denies all substance use Denies any previous inpatient psychiatric admissions Denies any military history  Last  Labs:  Admission on 06/26/2022, Discharged on 06/26/2022  Component Date Value Ref Range Status   WBC 06/26/2022 7.2  4.0 - 10.5 K/uL Final   RBC 06/26/2022 4.52  3.87 - 5.11 MIL/uL Final   Hemoglobin 06/26/2022 12.8  12.0 - 15.0 g/dL Final   HCT 40/34/7425 39.4  36.0 - 46.0 % Final   MCV 06/26/2022 87.2  80.0 - 100.0 fL Final   MCH 06/26/2022 28.3  26.0 - 34.0 pg Final   MCHC 06/26/2022 32.5  30.0 - 36.0 g/dL Final   RDW 95/63/8756 14.1  11.5 - 15.5 % Final   Platelets 06/26/2022 277  150 - 400 K/uL Final   nRBC 06/26/2022 0.0  0.0 - 0.2 % Final   Neutrophils Relative % 06/26/2022 73  % Final   Neutro Abs 06/26/2022 5.4  1.7 - 7.7 K/uL Final   Lymphocytes Relative 06/26/2022 17  % Final   Lymphs Abs 06/26/2022 1.2  0.7 - 4.0 K/uL Final   Monocytes Relative 06/26/2022 8  % Final   Monocytes Absolute 06/26/2022 0.5  0.1 - 1.0 K/uL Final   Eosinophils Relative 06/26/2022 1  % Final   Eosinophils Absolute 06/26/2022 0.1  0.0 - 0.5 K/uL Final   Basophils Relative 06/26/2022 1  % Final   Basophils Absolute 06/26/2022 0.1  0.0 - 0.1 K/uL Final   Immature Granulocytes 06/26/2022 0  % Final   Abs Immature Granulocytes 06/26/2022 0.02  0.00 - 0.07 K/uL Final   Performed at [facility], [address] [place], [place] [address]   Sodium 06/26/2022 137  135 - 145 mmol/L Final   Potassium 06/26/2022 3.9  3.5 - 5.1 mmol/L Final   Chloride 06/26/2022 108  98 - 111 mmol/L Final   CO2 06/26/2022 25  22 - 32 mmol/L Final   Glucose, Bld 06/26/2022 91  70 - 99 mg/dL Final   Glucose reference range applies only to samples taken after fasting for at least 8 hours.   BUN 06/26/2022 12  6 - 20 mg/dL Final   Creatinine, Ser 06/26/2022 0.68  0.44 - 1.00 mg/dL Final   Calcium 51/88/4166 8.2 (L)  8.9 - 10.3 mg/dL Final   Total Protein 10/25/1599 7.2  6.5 - 8.1 g/dL Final   Albumin 09/32/3557 3.3 (L)  3.5 - 5.0 g/dL Final   AST 32/20/2542 17  15 - 41 U/L Final  ALT 06/26/2022 15  0 - 44 U/L  Final   Alkaline Phosphatase 06/26/2022 55  38 - 126 U/L Final   Total Bilirubin 06/26/2022 0.7  0.3 - 1.2 mg/dL Final   GFR, Estimated 06/26/2022 >60  >60 mL/min Final   Comment: (NOTE) Calculated using the CKD-EPI Creatinine Equation (2021)    Anion gap 06/26/2022 4 (L)  5 - 15 Final   Performed at [facility], [address] [place], [place] [address]   Lipase 06/26/2022 26  11 - 51 U/L Final   Performed at [facility], [address] [place], [place] [address]   Color, Urine 06/26/2022 YELLOW  YELLOW Final   APPearance 06/26/2022 CLEAR  CLEAR Final   Specific Gravity, Urine 06/26/2022 1.025  1.005 - 1.030 Final   pH 06/26/2022 6.5  5.0 - 8.0 Final   Glucose, UA 06/26/2022 NEGATIVE  NEGATIVE mg/dL Final   Hgb urine dipstick 06/26/2022 TRACE (A)  NEGATIVE Final   Bilirubin Urine 06/26/2022 NEGATIVE  NEGATIVE Final   Ketones, ur 06/26/2022 NEGATIVE  NEGATIVE mg/dL Final   Protein, ur 57/84/6962 NEGATIVE  NEGATIVE mg/dL Final   Nitrite 95/28/4132 NEGATIVE  NEGATIVE Final   Leukocytes,Ua 06/26/2022 NEGATIVE  NEGATIVE Final   Performed at [facility], [address] [place], [place] [address]   Specimen Description 06/26/2022    Final                   Value:URINE, CLEAN CATCH Performed at [facility], [address], [place], [place] [address]    Special Requests 06/26/2022    Final                   Value:NONE Performed at [facility], [address] [place], [place] [address]    Culture 06/26/2022 MULTIPLE SPECIES PRESENT, SUGGEST RECOLLECTION (A)   Final   Report Status 06/26/2022 06/28/2022 FINAL   Final   Yeast Wet Prep HPF POC 06/26/2022 NONE SEEN  NONE SEEN Final   Trich, Wet Prep 06/26/2022 PRESENT (A)  NONE SEEN Final   Clue Cells Wet Prep HPF POC 06/26/2022 PRESENT (A)  NONE SEEN Final   WBC, Wet Prep HPF POC 06/26/2022 <10  <10 Final   Sperm 06/26/2022 NONE SEEN   Final   Performed at [facility], [address] [place], [place] [address]   Chlamydia 06/26/2022 Negative   Final   Neisseria Gonorrhea 06/26/2022 Negative   Final   Comment 06/26/2022 Normal Reference Ranger Chlamydia - Negative   Final   Comment 06/26/2022 Normal Reference Range Neisseria Gonorrhea - Negative   Final   Preg, Serum 06/26/2022 NEGATIVE  NEGATIVE Final   Comment:        THE SENSITIVITY OF THIS METHODOLOGY IS >10 mIU/mL. Performed at [facility], [address] [place], [place] [address]    RBC / HPF 06/26/2022 0-5  0 - 5 RBC/hpf Final   WBC, UA 06/26/2022 0-5  0 - 5 WBC/hpf Final   Bacteria, UA 06/26/2022 RARE (A)  NONE SEEN Final   Squamous Epithelial / HPF 06/26/2022 0-5  0 - 5 /HPF Final   Performed at [facility], [address] [place], [place] [address]    Allergies: Patient has no known allergies.  Medications:  Facility Ordered Medications  Medication   acetaminophen (TYLENOL) tablet 650 mg  alum & mag hydroxide-simeth (MAALOX/MYLANTA) 200-200-20 MG/5ML suspension 30 mL   magnesium hydroxide (MILK OF MAGNESIA) suspension 30 mL   hydrOXYzine (ATARAX) tablet 25 mg   traZODone (DESYREL) tablet 50 mg   PTA Medications  Medication Sig   naproxen (NAPROSYN) 500 MG tablet Take 1 tablet (500 mg total) by mouth 2 (two) times daily with a meal. (Patient not taking: Reported on 06/26/2022)   ondansetron (ZOFRAN-ODT) 8 MG disintegrating tablet Take 1 tablet (8 mg total) by mouth every 8 (eight) hours as needed for nausea or vomiting. (Patient not taking: Reported on 06/26/2022)   escitalopram (LEXAPRO) 10 MG tablet Take 10 mg by mouth every morning.   busPIRone (BUSPAR) 5 MG tablet Take 5 mg by mouth daily.   metroNIDAZOLE (FLAGYL) 500 MG tablet Take 1 tablet (500 mg total) by mouth 2 (two) times daily.      Medical Decision Making  Patient meets criteria for inpatient psychiatric admission.  She will be admitted to the continuous assessment unit while awaiting  inpatient psychiatric bed availability.    Recommendations  Based on my evaluation the patient does not appear to have an emergency medical condition.  Patient meets criteria for inpatient psychiatric admission.  [facility] notified and patient is under review.   Medications  Will increase home Lexapro 10 mg to 20 mg daily. Will increase home BuSpar 5 mg daily to BuSpar 5 mg twice daily Restart amlodipine 10 mg daily  Lab Orders         CBC with Differential/Platelet         Comprehensive metabolic panel         Hemoglobin A1c         Magnesium         Ethanol         Lipid panel         TSH         Urinalysis, Complete w Microscopic -Urine, Clean Catch         POC urine preg, ED         POCT Urine Drug Screen - (I-Screen)     EKG     Ardis Hughs, NP 12/26/22  5:18 PM

## 2022-12-26 NOTE — ED Notes (Signed)
Patient observed/assessed at bedside lying in bed asleep. Patient alert and oriented to self and location. Affect is flat and patient seems anxious. Patient denies pain and anxiety. He denies A/V/H. He denies having any thoughts/plan of self harm and harm towards others. Rejected snack when offered. Patient states that appetite has been good throughout the day. Verbalizes no further complaints at this time. Will continue to monitor for safety and provide support.

## 2022-12-26 NOTE — ED Notes (Signed)
Report given to [facility].

## 2022-12-27 ENCOUNTER — Inpatient Hospital Stay
Admission: AD | Admit: 2022-12-27 | Discharge: 2022-12-30 | DRG: 885 | Disposition: A | Discharge status: Home / Self Care | Payer: BC Managed Care – PPO | Source: Intra-hospital | Attending: Psychiatry | Admitting: Psychiatry

## 2022-12-27 ENCOUNTER — Other Ambulatory Visit: Payer: Self-pay

## 2022-12-27 ENCOUNTER — Encounter: Payer: Self-pay | Admitting: Psychiatry

## 2022-12-27 DIAGNOSIS — Z5986 Financial insecurity: Secondary | ICD-10-CM

## 2022-12-27 DIAGNOSIS — Z79899 Other long term (current) drug therapy: Secondary | ICD-10-CM

## 2022-12-27 DIAGNOSIS — R45851 Suicidal ideations: Secondary | ICD-10-CM | POA: Diagnosis present

## 2022-12-27 DIAGNOSIS — F332 Major depressive disorder, recurrent severe without psychotic features: Principal | ICD-10-CM | POA: Diagnosis present

## 2022-12-27 DIAGNOSIS — K5900 Constipation, unspecified: Secondary | ICD-10-CM | POA: Diagnosis present

## 2022-12-27 DIAGNOSIS — R451 Restlessness and agitation: Secondary | ICD-10-CM | POA: Diagnosis present

## 2022-12-27 DIAGNOSIS — F419 Anxiety disorder, unspecified: Secondary | ICD-10-CM | POA: Diagnosis present

## 2022-12-27 DIAGNOSIS — K30 Functional dyspepsia: Secondary | ICD-10-CM | POA: Diagnosis present

## 2022-12-27 MED ORDER — ALUM & MAG HYDROXIDE-SIMETH 200-200-20 MG/5ML PO SUSP: 30.0000 mL | ORAL | Status: DC | PRN

## 2022-12-27 MED ORDER — HALOPERIDOL 5 MG PO TABS: 5.0000 mg | ORAL_TABLET | Freq: Three times a day (TID) | ORAL | Status: DC | PRN

## 2022-12-27 MED ORDER — HYDROXYZINE HCL 25 MG PO TABS
25.0000 mg | ORAL_TABLET | Freq: Three times a day (TID) | ORAL | Status: DC | PRN
  Administered 2022-12-27: 25 mg via ORAL
  Filled 2022-12-27 (×2): qty 1

## 2022-12-27 MED ORDER — HALOPERIDOL LACTATE 5 MG/ML IJ SOLN: 5.0000 mg | Freq: Three times a day (TID) | INTRAMUSCULAR | Status: DC | PRN

## 2022-12-27 MED ORDER — TRAZODONE HCL 50 MG PO TABS
50.0000 mg | ORAL_TABLET | Freq: Every evening | ORAL | Status: DC | PRN
  Filled 2022-12-27 (×2): qty 1

## 2022-12-27 MED ORDER — ESCITALOPRAM OXALATE 10 MG PO TABS
20.0000 mg | ORAL_TABLET | Freq: Every day | ORAL | Status: DC
  Administered 2022-12-27 – 2022-12-30 (×4): 20 mg via ORAL
  Filled 2022-12-27 (×5): qty 2

## 2022-12-27 MED ORDER — DIPHENHYDRAMINE HCL 50 MG/ML IJ SOLN: 50.0000 mg | Freq: Three times a day (TID) | INTRAMUSCULAR | Status: DC | PRN

## 2022-12-27 MED ORDER — LORAZEPAM 2 MG/ML IJ SOLN: 2.0000 mg | Freq: Three times a day (TID) | INTRAMUSCULAR | Status: DC | PRN

## 2022-12-27 MED ORDER — ACETAMINOPHEN 325 MG PO TABS: 650.0000 mg | ORAL_TABLET | Freq: Four times a day (QID) | ORAL | Status: DC | PRN

## 2022-12-27 MED ORDER — BUSPIRONE HCL 5 MG PO TABS
5.0000 mg | ORAL_TABLET | Freq: Three times a day (TID) | ORAL | Status: DC
  Administered 2022-12-27 – 2022-12-30 (×9): 5 mg via ORAL
  Filled 2022-12-27 (×9): qty 1

## 2022-12-27 MED ORDER — DIPHENHYDRAMINE HCL 25 MG PO CAPS: 50.0000 mg | ORAL_CAPSULE | Freq: Three times a day (TID) | ORAL | Status: DC | PRN

## 2022-12-27 MED ORDER — LORAZEPAM 2 MG PO TABS: 2.0000 mg | ORAL_TABLET | Freq: Three times a day (TID) | ORAL | Status: DC | PRN

## 2022-12-27 MED ORDER — MAGNESIUM HYDROXIDE 400 MG/5ML PO SUSP: 30.0000 mL | Freq: Every day | ORAL | Status: DC | PRN

## 2022-12-27 NOTE — Group Note (Signed)
Date:  12/27/2022 Time:  6:45 PM  Group Topic/Focus:  Healthy Communication:   The focus of this group is to discuss communication, barriers to communication, as well as healthy ways to communicate with others.    Participation Level:  Active  Participation Quality:  Appropriate and Attentive  Affect:  Appropriate  Cognitive:  Alert, Appropriate, and Oriented  Insight: Appropriate  Engagement in Group:  Developing/Improving and Engaged  Modes of Intervention:  Activity, Discussion, Rapport Building, and Socialization  Additional Comments:    Rosaura Carpenter 12/27/2022, 6:45 PM

## 2022-12-27 NOTE — H&P (Signed)
Psychiatric Admission Assessment Adult  Patient Identification: Allison Tapia MRN:  [id number] Date of Evaluation:  12/27/2022 Chief Complaint:  MDD (major depressive disorder), recurrent episode, severe (HCC) [F33.2] Principal Diagnosis: MDD (major depressive disorder), recurrent episode, severe (HCC) Diagnosis:  Principal Problem:   MDD (major depressive disorder), recurrent episode, severe (HCC)  History of Present Illness: Allison Tapia, 37 y.o., female patient who presented to [facility] as a walk in  accompanied by her friend and mother with complaints of increased depression.  Today during assessment patient mentioned that lately she has been feeling depressed.  Patient also shared that yesterday was her grandmothers and friend's birthday who had passed away.  Patient was feeling sad and tearful.  She has anhedonia and at times feels helpless and hopeless.  Patient said today she feels better.  She denies any intention of harming herself or others.  Reportedly patient has told her sister that she was going to kill herself.  Patient minimizes her symptoms of depression.  She said that she will not harm herself because she loves her 42-year-old daughter.  She wants to be around for her daughter.  Patient denies manic episodes at present or in the past.  Patient denies auditory visual hallucination.  Patient was provided with support.  She was encouraged to attend group and work on coping strategies and a safe discharge plan.   Past Psychiatric History: Patient reports history of depression and anxiety.  She takes Lexapro and BuSpar.  She denies past history of suicide attempt or inpatient psychiatric treatment.  Is the patient at risk to self? Yes.    Has the patient been a risk to self in the past 6 months? No.  Has the patient been a risk to self within the distant past? No.  Is the patient a risk to others? No.  Has the patient been a risk to others in the past 6 months? No.  Has the patient  been a risk to others within the distant past? No.   [place] Scale:  Flowsheet Row Admission (Current) from 12/27/2022 in [facility] INPATIENT BEHAVIORAL MEDICINE ED from 12/26/2022 in [facility] ED from 06/26/2022 in [facility] Emergency Department at [facility]  C-SSRS RISK CATEGORY No Risk No Risk No Risk        Prior Inpatient Therapy: No.   Prior Outpatient Therapy: Yes.    Alcohol Screening: 1. How often do you have a drink containing alcohol?: Never 2. How many drinks containing alcohol do you have on a typical day when you are drinking?: 1 or 2 3. How often do you have six or more drinks on one occasion?: Never AUDIT-C Score: 0 4. How often during the last year have you found that you were not able to stop drinking once you had started?: Never 5. How often during the last year have you failed to do what was normally expected from you because of drinking?: Never 6. How often during the last year have you needed a first drink in the morning to get yourself going after a heavy drinking session?: Never 7. How often during the last year have you had a feeling of guilt of remorse after drinking?: Never 8. How often during the last year have you been unable to remember what happened the night before because you had been drinking?: Never 9. Have you or someone else been injured as a result of your drinking?: No 10. Has a relative or friend or a doctor or another  health worker been concerned about your drinking or suggested you cut down?: Yes, but not in the last year Alcohol Use Disorder Identification Test Final Score (AUDIT): 2 Alcohol Brief Interventions/Follow-up: Alcohol education/Brief advice Substance Abuse History in the last 12 months:   Patient denies use of alcohol, illicit drug, or nicotine  Previous Psychotropic Medications: Yes  Psychological Evaluations: Yes  Past Medical History:  Past Medical History:  Diagnosis Date   Anxiety     Depression    History reviewed. No pertinent surgical history. Family History: History reviewed. No pertinent family history. Family Psychiatric  History: None reported by patient Tobacco Screening:  Social History   Tobacco Use  Smoking Status Never  Smokeless Tobacco Never    BH Tobacco Counseling     Are you interested in Tobacco Cessation Medications?  No value filed. Counseled patient on smoking cessation:  No value filed. Reason Tobacco Screening Not Completed: No value filed.       Social History:  Social History   Substance and Sexual Activity  Alcohol Use Not Currently     Social History   Substance and Sexual Activity  Drug Use Never    Additional Social History:       Patient reports that she lives by herself she has a 23-year-old daughter who with patient's mother.  Patient also has 23 year old child who is in college.  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                    Allergies:  No Known Allergies Lab Results:  Results for orders placed or performed during the hospital encounter of 12/26/22 (from the past 48 hour(s))  CBC with Differential/Platelet     Status: None   Collection Time: 12/26/22  5:35 PM  Result Value Ref Range   WBC 7.0 4.0 - 10.5 K/uL   RBC 4.85 3.87 - 5.11 MIL/uL   Hemoglobin 13.6 12.0 - 15.0 g/dL   HCT 53.6 64.4 - 03.4 %   MCV 85.6 80.0 - 100.0 fL   MCH 28.0 26.0 - 34.0 pg   MCHC 32.8 30.0 - 36.0 g/dL   RDW 74.2 59.5 - 63.8 %   Platelets 281 150 - 400 K/uL   nRBC 0.0 0.0 - 0.2 %   Neutrophils Relative % 71 %   Neutro Abs 5.0 1.7 - 7.7 K/uL   Lymphocytes Relative 18 %   Lymphs Abs 1.3 0.7 - 4.0 K/uL   Monocytes Relative 9 %   Monocytes Absolute 0.6 0.1 - 1.0 K/uL   Eosinophils Relative 1 %   Eosinophils Absolute 0.1 0.0 - 0.5 K/uL   Basophils Relative 1 %   Basophils Absolute 0.1 0.0 - 0.1 K/uL   Immature Granulocytes 0 %   Abs Immature Granulocytes 0.03 0.00 - 0.07 K/uL    Comment: Performed at  [facility], [address], [place], [place] [address]  Comprehensive metabolic panel     Status: None   Collection Time: 12/26/22  5:35 PM  Result Value Ref Range   Sodium 137 135 - 145 mmol/L   Potassium 3.6 3.5 - 5.1 mmol/L   Chloride 103 98 - 111 mmol/L   CO2 24 22 - 32 mmol/L   Glucose, Bld 82 70 - 99 mg/dL    Comment: Glucose reference range applies only to samples taken after fasting for at least 8 hours.   BUN 7 6 - 20 mg/dL   Creatinine, Ser 3.29 0.44 -  1.00 mg/dL   Calcium 9.0 8.9 - 16.1 mg/dL   Total Protein 6.8 6.5 - 8.1 g/dL   Albumin 3.6 3.5 - 5.0 g/dL   AST 20 15 - 41 U/L   ALT 17 0 - 44 U/L   Alkaline Phosphatase 48 38 - 126 U/L   Total Bilirubin 0.5 0.3 - 1.2 mg/dL   GFR, Estimated >09 >60 mL/min    Comment: (NOTE) Calculated using the CKD-EPI Creatinine Equation (2021)    Anion gap 10 5 - 15    Comment: Performed at [facility], [address], [place], [place] [address]  Hemoglobin A1c     Status: None   Collection Time: 12/26/22  5:35 PM  Result Value Ref Range   Hgb A1c MFr Bld 5.5 4.8 - 5.6 %    Comment: (NOTE) Pre diabetes:          5.7%-6.4%  Diabetes:              >6.4%  Glycemic control for   <7.0% adults with diabetes    Mean Plasma Glucose 111.15 mg/dL    Comment: Performed at [facility], [address] [place], [place] [address]  Magnesium     Status: None   Collection Time: 12/26/22  5:35 PM  Result Value Ref Range   Magnesium 1.8 1.7 - 2.4 mg/dL    Comment: Performed at [facility], [address], [place], [place] [address]  Ethanol     Status: None   Collection Time: 12/26/22  5:35 PM  Result Value Ref Range   Alcohol, Ethyl (B) <10 <10 mg/dL    Comment: (NOTE) Lowest detectable limit for serum alcohol is 10 mg/dL.  For medical purposes only. Performed at [facility], [address], [place], [place] [address]   Lipid panel     Status: Abnormal   Collection Time: 12/26/22  5:35 PM  Result  Value Ref Range   Cholesterol 205 (H) 0 - 200 mg/dL   Triglycerides 086 <578 mg/dL   HDL 36 (L) >46 mg/dL   Total CHOL/HDL Ratio 5.7 RATIO   VLDL 20 0 - 40 mg/dL   LDL Cholesterol 962 (H) 0 - 99 mg/dL    Comment:        Total Cholesterol/HDL:CHD Risk Coronary Heart Disease Risk Table                     Men   Women  1/2 Average Risk   3.4   3.3  Average Risk       5.0   4.4  2 X Average Risk   9.6   7.1  3 X Average Risk  23.4   11.0        Use the calculated Patient Ratio above and the CHD Risk Table to determine the patient's CHD Risk.        ATP III CLASSIFICATION (LDL):  <100     mg/dL   Optimal  952-841  mg/dL   Near or Above                    Optimal  130-159  mg/dL   Borderline  324-401  mg/dL   High  >027     mg/dL   Very High Performed at [facility], [address], [place], [place] [address]   TSH     Status: None   Collection Time: 12/26/22  5:35  PM  Result Value Ref Range   TSH 2.598 0.350 - 4.500 uIU/mL    Comment: Performed by a 3rd Generation assay with a functional sensitivity of <=0.01 uIU/mL. Performed at [facility], [address], [place], [place] [address]   POC urine preg, ED     Status: None   Collection Time: 12/26/22  5:55 PM  Result Value Ref Range   Preg Test, Ur Negative Negative  POCT Urine Drug Screen - (I-Screen)     Status: Normal   Collection Time: 12/26/22  5:55 PM  Result Value Ref Range   POC Amphetamine UR None Detected NONE DETECTED (Cut Off Level 1000 ng/mL)   POC Secobarbital (BAR) None Detected NONE DETECTED (Cut Off Level 300 ng/mL)   POC Buprenorphine (BUP) None Detected NONE DETECTED (Cut Off Level 10 ng/mL)   POC Oxazepam (BZO) None Detected NONE DETECTED (Cut Off Level 300 ng/mL)   POC Cocaine UR None Detected NONE DETECTED (Cut Off Level 300 ng/mL)   POC Methamphetamine UR None Detected NONE DETECTED (Cut Off Level 1000 ng/mL)   POC Morphine None Detected NONE DETECTED (Cut Off Level 300 ng/mL)   POC  Methadone UR None Detected NONE DETECTED (Cut Off Level 300 ng/mL)   POC Oxycodone UR None Detected NONE DETECTED (Cut Off Level 100 ng/mL)   POC Marijuana UR None Detected NONE DETECTED (Cut Off Level 50 ng/mL)  Urinalysis, Complete w Microscopic -Urine, Clean Catch     Status: Abnormal   Collection Time: 12/26/22  6:45 PM  Result Value Ref Range   Color, Urine YELLOW YELLOW   APPearance HAZY (A) CLEAR   Specific Gravity, Urine 1.026 1.005 - 1.030   pH 5.0 5.0 - 8.0   Glucose, UA NEGATIVE NEGATIVE mg/dL   Hgb urine dipstick SMALL (A) NEGATIVE   Bilirubin Urine NEGATIVE NEGATIVE   Ketones, ur 5 (A) NEGATIVE mg/dL   Protein, ur NEGATIVE NEGATIVE mg/dL   Nitrite NEGATIVE NEGATIVE   Leukocytes,Ua NEGATIVE NEGATIVE   RBC / HPF 0-5 0 - 5 RBC/hpf   WBC, UA 0-5 0 - 5 WBC/hpf   Bacteria, UA NONE SEEN NONE SEEN   Squamous Epithelial / HPF 0-5 0 - 5 /HPF   Mucus PRESENT     Comment: Performed at [facility], [address] [place], [place] [address]    Blood Alcohol level:  Lab Results  Component Value Date   [facility] <10 12/26/2022    Metabolic Disorder Labs:  Lab Results  Component Value Date   HGBA1C 5.5 12/26/2022   MPG 111.15 12/26/2022   No results found for: "PROLACTIN" Lab Results  Component Value Date   CHOL 205 (H) 12/26/2022   TRIG 100 12/26/2022   HDL 36 (L) 12/26/2022   CHOLHDL 5.7 12/26/2022   VLDL 20 12/26/2022   LDLCALC 149 (H) 12/26/2022    Current Medications: Current Facility-Administered Medications  Medication Dose Route Frequency Provider Last Rate Last Admin   acetaminophen (TYLENOL) tablet 650 mg  650 mg Oral Q6H PRN Ajibola, Ene A, NP       alum & mag hydroxide-simeth (MAALOX/MYLANTA) 200-200-20 MG/5ML suspension 30 mL  30 mL Oral Q4H PRN Ajibola, Ene A, NP       diphenhydrAMINE (BENADRYL) capsule 50 mg  50 mg Oral TID PRN Ajibola, Ene A, NP       Or   diphenhydrAMINE (BENADRYL) injection 50 mg  50 mg Intramuscular TID PRN Ajibola, Ene A, NP  haloperidol (HALDOL) tablet 5 mg  5 mg Oral TID PRN Ajibola, Ene A, NP       Or   haloperidol lactate (HALDOL) injection 5 mg  5 mg Intramuscular TID PRN Ajibola, Ene A, NP       hydrOXYzine (ATARAX) tablet 25 mg  25 mg Oral TID PRN Ajibola, Ene A, NP       LORazepam (ATIVAN) tablet 2 mg  2 mg Oral TID PRN Ajibola, Ene A, NP       Or   LORazepam (ATIVAN) injection 2 mg  2 mg Intramuscular TID PRN Ajibola, Ene A, NP       magnesium hydroxide (MILK OF MAGNESIA) suspension 30 mL  30 mL Oral Daily PRN Ajibola, Ene A, NP       traZODone (DESYREL) tablet 50 mg  50 mg Oral QHS PRN Ajibola, Ene A, NP       PTA Medications: Medications Prior to Admission  Medication Sig Dispense Refill Last Dose   busPIRone (BUSPAR) 5 MG tablet Take 5 mg by mouth daily.      escitalopram (LEXAPRO) 10 MG tablet Take 10 mg by mouth every morning.      metroNIDAZOLE (FLAGYL) 500 MG tablet Take 1 tablet (500 mg total) by mouth 2 (two) times daily. 14 tablet 0    naproxen (NAPROSYN) 500 MG tablet Take 1 tablet (500 mg total) by mouth 2 (two) times daily with a meal. (Patient not taking: Reported on 06/26/2022) 20 tablet 0    ondansetron (ZOFRAN-ODT) 8 MG disintegrating tablet Take 1 tablet (8 mg total) by mouth every 8 (eight) hours as needed for nausea or vomiting. (Patient not taking: Reported on 06/26/2022) 10 tablet 0     Musculoskeletal: Strength & Muscle Tone: within normal limits Gait & Station: normal Patient leans: N/A   Psychiatric Specialty Exam:   Presentation  General Appearance:  Appropriate for Environment   Eye Contact: Good   Speech: Clear and Coherent   Speech Volume: Normal   Handedness: Right     Mood and Affect  Mood: Depressed; Anxious   Affect: Congruent; Restricted     Thought Process  Thought Processes: Coherent; Goal Directed   Descriptions of Associations:Intact   Orientation:Full (Time, Place and Person)   Thought Content:Abstract Reasoning   History of  Schizophrenia/Schizoaffective disorder:No   Duration of Psychotic Symptoms:No data recorded Hallucinations:Hallucinations: None   Ideas of Reference:None   Suicidal Thoughts:Suicidal Thoughts: No SI Passive Intent and/or Plan: Without Intent; Without Plan; Without Means to Carry Out   Homicidal Thoughts:Homicidal Thoughts: No     Sensorium  Memory: Immediate Good; Recent Good; Remote Good   Judgment: Limited   Insight: Limited     Executive Functions  Concentration: Good   Attention Span: Fair   Recall: [employer] of Knowledge: Fair   Language: Fair     Psychomotor Activity  Psychomotor Activity: Psychomotor Activity: Normal     Assets  Assets: Communication Skills; Desire for Improvement; Social Support; Vocational/Educational; Housing     Sleep  Sleep: Sleep: Fair Number of Hours of Sleep: 0 (unable to state amount other tha n "too much")       Physical Exam: Physical Exam Constitutional:      Appearance: Normal appearance.  HENT:     Head: Normocephalic and atraumatic.     Nose: Nose normal.  Eyes:     Pupils: Pupils are equal, round, and reactive to light.  Cardiovascular:     Rate and  Rhythm: Normal rate.     Pulses: Normal pulses.  Pulmonary:     Effort: Pulmonary effort is normal.  Musculoskeletal:     Cervical back: Normal range of motion.  Skin:    General: Skin is warm.  Neurological:     General: No focal deficit present.     Mental Status: She is alert and oriented to person, place, and time.      Review of Systems  Constitutional:  Positive for malaise/fatigue. Negative for fever.  HENT: Negative.    Eyes: Negative.   Respiratory: Negative.    Cardiovascular: Negative.   Gastrointestinal: Negative.   Genitourinary: Negative.   Musculoskeletal: Negative.   Skin: Negative.   Neurological: Negative.   Psychiatric/Behavioral:  Positive for depression. The patient is nervous/anxious.    Blood pressure (!) 136/91,  pulse 72, temperature 97.9 F (36.6 C), temperature source Oral, resp. rate 20, height 5\' 6"  (1.676 m), weight 125.2 kg, SpO2 99%. Body mass index is 44.55 kg/m.  Treatment Plan Summary: Daily contact with patient to assess and evaluate symptoms and progress in treatment and Medication management  Observation Level/Precautions:  15 minute checks  Laboratory:    Psychotherapy:    Medications:  Per MAR  Consultations:    Discharge Concerns:    Estimated LOS: 4-5 days  Other:     Physician Treatment Plan for Primary Diagnosis: MDD (major depressive disorder), recurrent episode, severe (HCC)  Patient is admitted to locked unit under suicide precautions -Will increase the dose of Lexapro to 20 mg by mouth daily  -Will increase the dose of BuSpar to 5 mg 3 times daily to help with anxiety -Will consult social worker to get collateral and help with a safe discharge plan -Encouraged the patient to attend group and work on coping strategies  Long Term Goal(s): Improvement in symptoms so as ready for discharge  Short Term Goals: Ability to identify changes in lifestyle to reduce recurrence of condition will improve, Ability to verbalize feelings will improve, Ability to disclose and discuss suicidal ideas, Ability to demonstrate self-control will improve, Ability to identify and develop effective coping behaviors will improve, Ability to maintain clinical measurements within normal limits will improve, and Ability to identify triggers associated with substance abuse/mental health issues will improve   I certify that inpatient services furnished can reasonably be expected to improve the patient's condition.    Lewanda Rife, MD

## 2022-12-27 NOTE — Group Note (Signed)
Date:  12/27/2022 Time:  8:51 PM  Group Topic/Focus:  Wrap-Up Group:   The focus of this group is to help patients review their daily goal of treatment and discuss progress on daily workbooks.    Participation Level:  Active  Participation Quality:  Appropriate, Attentive, Sharing, and Supportive  Affect:  Appropriate  Cognitive:  Appropriate  Insight: Good  Engagement in Group:  Supportive  Modes of Intervention:  Discussion and Support  Additional Comments:     Allison Tapia 12/27/2022, 8:51 PM

## 2022-12-27 NOTE — Plan of Care (Signed)
  Problem: Coping: Goal: Level of anxiety will decrease Outcome: Progressing   Problem: Education: Goal: Mental status will improve Outcome: Progressing  Patient alert and oriented x 4, appears less anxious.

## 2022-12-27 NOTE — Progress Notes (Signed)
Admission Note:  37 yr female who presents voluntary commitment in no acute distress for the treatment of suicidal ideation and depression. Patient upon admission is sad, she appears flat and depressed, she was calm and cooperative with admission process. Patient currently denies SI/HI/AVH and contracts for safety upon admission. Patient experienced worsening depression and sent good bye wishes to her family members who had her involuntarily committed. .Patient has Past medical Hx of, depression, Patient's skin was assessed and found to be  intact, warm and dry, in addition patient was searched and no contraband found, Plan of care and unit policies explained and understanding verbalized, and patient gave her consents. Patient was oriented to the unit, and offered snacks, 15 minutes safety checks maintained will continue to closely monitor.

## 2022-12-27 NOTE — Progress Notes (Signed)
   12/27/22 1300  Spiritual Encounters  Type of Visit Initial  Care provided to: Patient  Referral source Patient request  Reason for visit Routine spiritual support  OnCall Visit Yes  Spiritual Framework  Presenting Themes Values and beliefs  Interventions  Spiritual Care Interventions Made Prayer   Patient expressed desire to "come back" to God. Chaplain provided discussion and intercession to facilitate this renewal and return.

## 2022-12-27 NOTE — [facility] Suicide Risk Assessment (Signed)
[facility] Admission Suicide Risk Assessment   Nursing information obtained from:  Patient Demographic factors:  Adolescent or young adult Current Mental Status:  NA Loss Factors:  NA Historical Factors:  Impulsivity Risk Reduction Factors:  Responsible for children under 36 years of age, Religious beliefs about death, Positive social support, Employed   Principal Problem: MDD (major depressive disorder), recurrent episode, severe (HCC) Diagnosis:  Principal Problem:   MDD (major depressive disorder), recurrent episode, severe (HCC)  Subjective Data: Allison Tapia, 37 y.o., female patient who presented to [facility] as a walk in  accompanied by her friend and brother with complaints of increased depression.   Continued Clinical Symptoms:  Alcohol Use Disorder Identification Test Final Score (AUDIT): 2 The "Alcohol Use Disorders Identification Test", Guidelines for Use in Primary Care, Second Edition.  World Science writer [facility]). Score between 0-7:  no or low risk or alcohol related problems. Score between 8-15:  moderate risk of alcohol related problems. Score between 16-19:  high risk of alcohol related problems. Score 20 or above:  warrants further diagnostic evaluation for alcohol dependence and treatment.   CLINICAL FACTORS:   Depression:   Anhedonia Hopelessness Previous Psychiatric Diagnoses and Treatments   Musculoskeletal: Strength & Muscle Tone: within normal limits Gait & Station: normal Patient leans: N/A  Psychiatric Specialty Exam:  Presentation  General Appearance:  Appropriate for Environment  Eye Contact: Good  Speech: Clear and Coherent  Speech Volume: Normal  Handedness: Right   Mood and Affect  Mood: Depressed; Anxious  Affect: Congruent; Restricted   Thought Process  Thought Processes: Coherent; Goal Directed  Descriptions of Associations:Intact  Orientation:Full (Time, Place and Person)  Thought Content:Abstract  Reasoning  History of Schizophrenia/Schizoaffective disorder:No  Duration of Psychotic Symptoms:No data recorded Hallucinations:Hallucinations: None  Ideas of Reference:None  Suicidal Thoughts:Suicidal Thoughts: No SI Passive Intent and/or Plan: Without Intent; Without Plan; Without Means to Carry Out  Homicidal Thoughts:Homicidal Thoughts: No   Sensorium  Memory: Immediate Good; Recent Good; Remote Good  Judgment: Limited  Insight: Limited   Executive Functions  Concentration: Good  Attention Span: Fair  Recall: [employer] of Knowledge: Fair  Language: Fair   Psychomotor Activity  Psychomotor Activity: Psychomotor Activity: Normal   Assets  Assets: Communication Skills; Desire for Improvement; Social Support; Vocational/Educational; Housing   Sleep  Sleep: Sleep: Fair Number of Hours of Sleep: 0 (unable to state amount other tha n "too much")    Physical Exam: Physical Exam Constitutional:      Appearance: Normal appearance.  HENT:     Head: Normocephalic and atraumatic.     Nose: Nose normal.  Eyes:     Pupils: Pupils are equal, round, and reactive to light.  Cardiovascular:     Rate and Rhythm: Normal rate.     Pulses: Normal pulses.  Pulmonary:     Effort: Pulmonary effort is normal.  Musculoskeletal:     Cervical back: Normal range of motion.  Skin:    General: Skin is warm.  Neurological:     General: No focal deficit present.     Mental Status: She is alert and oriented to person, place, and time.    Review of Systems  Constitutional:  Positive for malaise/fatigue. Negative for fever.  HENT: Negative.    Eyes: Negative.   Respiratory: Negative.    Cardiovascular: Negative.   Gastrointestinal: Negative.   Genitourinary: Negative.   Musculoskeletal: Negative.   Skin: Negative.   Neurological: Negative.   Psychiatric/Behavioral:  Positive for depression.  The patient is nervous/anxious.    Blood pressure (!) 136/91,  pulse 72, temperature 97.9 F (36.6 C), temperature source Oral, resp. rate 20, height 5\' 6"  (1.676 m), weight 125.2 kg, SpO2 99%. Body mass index is 44.55 kg/m.   COGNITIVE FEATURES THAT CONTRIBUTE TO RISK:  Thought constriction (tunnel vision)    SUICIDE RISK:   Moderate:  Frequent suicidal ideation with limited intensity, and duration, some specificity in terms of plans, good self-control, limited dysphoria/symptomatology, some risk factors present, and identifiable protective factors, including available and accessible social support.  PLAN OF CARE: Per H&P  I certify that inpatient services furnished can reasonably be expected to improve the patient's condition.   Lewanda Rife, MD 12/27/2022, 1:56 PM

## 2022-12-27 NOTE — Group Note (Signed)
Date:  12/27/2022 Time:  3:05 PM  Group Topic/Focus:  Arts and creative therapies are treatments which involve creative activities within therapy sessions. They use different art forms, such as drawing, music or dance. Reduce levels of stress and anxiety. Increase self-esteem. Aid with self-discovery by helping acknowledge feelings in the subconscious. Letting go of negative thoughts and feelings.    Participation Level:  Active  Participation Quality:  Appropriate and Attentive  Affect:  Appropriate  Cognitive:  Alert and Appropriate  Insight: Appropriate  Engagement in Group:  Developing/Improving  Modes of Intervention:  Activity, Rapport Building, and Socialization  Additional Comments:    Rosaura Carpenter 12/27/2022, 3:05 PM

## 2022-12-27 NOTE — Group Note (Signed)
Date:  12/27/2022 Time:  12:06 PM  Group Topic/Focus:  Outdoor Recreation. Music Therapy. Structured Outdoor Activity.    Participation Level:  Active  Participation Quality:  Appropriate  Affect:  Appropriate  Cognitive:  Alert and Appropriate  Insight: Appropriate  Engagement in Group:  Developing/Improving  Modes of Intervention:  Activity and Rapport Building  Additional Comments:    Rosaura Carpenter 12/27/2022, 12:06 PM

## 2022-12-27 NOTE — Tx Team (Signed)
Initial Treatment Plan 12/27/2022 3:53 AM Allison Tapia [id number]:[id number]    PATIENT STRESSORS: Medication change or noncompliance   Occupational concerns     PATIENT STRENGTHS: Ability for insight  Motivation for treatment/growth    PATIENT IDENTIFIED PROBLEMS: Suicidal ideation    Depression                    DISCHARGE CRITERIA:  Improved stabilization in mood, thinking, and/or behavior Motivation to continue treatment in a less acute level of care  PRELIMINARY DISCHARGE PLAN: Outpatient therapy  PATIENT/FAMILY INVOLVEMENT: This treatment plan has been presented to and reviewed with the patient, Allison Tapia, The patient and family have been given the opportunity to ask questions and make suggestions.  Trula Ore, RN 12/27/2022, 3:53 AM

## 2022-12-27 NOTE — Plan of Care (Signed)
Pt is new admit last night. No signs of distress or injury. Pt denies SI / HI / AVH. Pt is focused on being seen by provider. Reports depression and sadness. Staff will continue to monitor for safety.     Problem: Education: Goal: Knowledge of General Education information will improve Description: Including pain rating scale, medication(s)/side effects and non-pharmacologic comfort measures Outcome: Not Progressing   Problem: Health Behavior/Discharge Planning: Goal: Ability to manage health-related needs will improve Outcome: Not Progressing   Problem: Clinical Measurements: Goal: Ability to maintain clinical measurements within normal limits will improve Outcome: Not Progressing

## 2022-12-28 DIAGNOSIS — F419 Anxiety disorder, unspecified: Secondary | ICD-10-CM | POA: Diagnosis not present

## 2022-12-28 DIAGNOSIS — F332 Major depressive disorder, recurrent severe without psychotic features: Secondary | ICD-10-CM | POA: Diagnosis not present

## 2022-12-28 NOTE — Progress Notes (Signed)
[facility] MD Progress Note  12/28/2022  Allison Tapia  MRN:  [id number]  Subjective:   Allison Tapia, 37 y.o., female patient who presented to [facility] as a walk in  accompanied by her friend and mother with complaints of increased depression.  Case discussed with staff in multidisciplinary team today, chart reviewed, patient seen during treatment team meeting and during rounds today.  Patient reports she is feeling better.  Reports her sleep and appetite has improved.  Patient denies thoughts of harming herself or others.  Patient reports that she does not recall sending a text to her sister about suicide thoughts or intentions.  Patient was encouraged to attend group and work on coping strategies.  Patient was informed that social worker will call patient's mother to get collateral and have a safe discharge plan in place prior to patient's discharge from here.  Patient agrees with the plan.   Principal Problem: MDD (major depressive disorder), recurrent severe, without psychosis (HCC) Diagnosis: Principal Problem:   MDD (major depressive disorder), recurrent severe, without psychosis (HCC) Active Problems:   Anxiety disorder   Past Psychiatric History:  Patient reports history of depression and anxiety.  She takes Lexapro and BuSpar.  She denies past history of suicide attempt or inpatient psychiatric treatment.   Past Medical History:  Past Medical History:  Diagnosis Date   Anxiety    Depression    History reviewed. No pertinent surgical history. Family History: History reviewed. No pertinent family history.  Social History:  Social History   Substance and Sexual Activity  Alcohol Use Not Currently     Social History   Substance and Sexual Activity  Drug Use Never    Social History   Socioeconomic History   Marital status: Single    Spouse name: Not on file   Number of children: Not on file   Years of education: Not on file   Highest education level: Not on file   Occupational History   Not on file  Tobacco Use   Smoking status: Never   Smokeless tobacco: Never  Vaping Use   Vaping status: Never Used  Substance and Sexual Activity   Alcohol use: Not Currently   Drug use: Never   Sexual activity: Yes  Other Topics Concern   Not on file  Social History Narrative   Not on file   Social Determinants of Health   Financial Resource Strain: Medium Risk (11/18/2022)   Received from [facility]   Overall Financial Resource Strain (CARDIA)    Difficulty of Paying Living Expenses: Somewhat hard  Food Insecurity: No Food Insecurity (12/27/2022)   Hunger Vital Sign    Worried About Running Out of Food in the Last Year: Never true    Ran Out of Food in the Last Year: Never true  Transportation Needs: No Transportation Needs (12/27/2022)   PRAPARE - Administrator, Civil Service (Medical): No    Lack of Transportation (Non-Medical): No  Physical Activity: Unknown (11/18/2022)   Received from [facility]   Exercise Vital Sign    Days of Exercise per Week: 3 days    Minutes of Exercise per Session: Not on file  Stress: Stress Concern Present (11/18/2022)   Received from [facility] - Occupational Stress Questionnaire    Feeling of Stress : Very much  Social Connections: Unknown (07/29/2022)   Received from [facility]   Social Network    Social Network: Not on file  Additional Social History:                         Sleep: Fair  Appetite:  Good  Current Medications: Current Facility-Administered Medications  Medication Dose Route Frequency Provider Last Rate Last Admin   acetaminophen (TYLENOL) tablet 650 mg  650 mg Oral Q6H PRN Ajibola, Ene A, NP       alum & mag hydroxide-simeth (MAALOX/MYLANTA) 200-200-20 MG/5ML suspension 30 mL  30 mL Oral Q4H PRN Ajibola, Ene A, NP       busPIRone (BUSPAR) tablet 5 mg  5 mg Oral TID Lewanda Rife, MD   5 mg at 12/28/22 1238    diphenhydrAMINE (BENADRYL) capsule 50 mg  50 mg Oral TID PRN Ajibola, Ene A, NP       Or   diphenhydrAMINE (BENADRYL) injection 50 mg  50 mg Intramuscular TID PRN Ajibola, Ene A, NP       escitalopram (LEXAPRO) tablet 20 mg  20 mg Oral Daily Lewanda Rife, MD   20 mg at 12/28/22 4098   haloperidol (HALDOL) tablet 5 mg  5 mg Oral TID PRN Ajibola, Ene A, NP       Or   haloperidol lactate (HALDOL) injection 5 mg  5 mg Intramuscular TID PRN Ajibola, Ene A, NP       hydrOXYzine (ATARAX) tablet 25 mg  25 mg Oral TID PRN Ajibola, Ene A, NP   25 mg at 12/27/22 2112   LORazepam (ATIVAN) tablet 2 mg  2 mg Oral TID PRN Ajibola, Ene A, NP       Or   LORazepam (ATIVAN) injection 2 mg  2 mg Intramuscular TID PRN Ajibola, Ene A, NP       magnesium hydroxide (MILK OF MAGNESIA) suspension 30 mL  30 mL Oral Daily PRN Ajibola, Ene A, NP       traZODone (DESYREL) tablet 50 mg  50 mg Oral QHS PRN Ajibola, Ene A, NP        Lab Results:  Results for orders placed or performed during the hospital encounter of 12/26/22 (from the past 48 hour(s))  CBC with Differential/Platelet     Status: None   Collection Time: 12/26/22  5:35 PM  Result Value Ref Range   WBC 7.0 4.0 - 10.5 K/uL   RBC 4.85 3.87 - 5.11 MIL/uL   Hemoglobin 13.6 12.0 - 15.0 g/dL   HCT 11.9 14.7 - 82.9 %   MCV 85.6 80.0 - 100.0 fL   MCH 28.0 26.0 - 34.0 pg   MCHC 32.8 30.0 - 36.0 g/dL   RDW 56.2 13.0 - 86.5 %   Platelets 281 150 - 400 K/uL   nRBC 0.0 0.0 - 0.2 %   Neutrophils Relative % 71 %   Neutro Abs 5.0 1.7 - 7.7 K/uL   Lymphocytes Relative 18 %   Lymphs Abs 1.3 0.7 - 4.0 K/uL   Monocytes Relative 9 %   Monocytes Absolute 0.6 0.1 - 1.0 K/uL   Eosinophils Relative 1 %   Eosinophils Absolute 0.1 0.0 - 0.5 K/uL   Basophils Relative 1 %   Basophils Absolute 0.1 0.0 - 0.1 K/uL   Immature Granulocytes 0 %   Abs Immature Granulocytes 0.03 0.00 - 0.07 K/uL    Comment: Performed at [facility], [address], [place],  [place] [address]  Comprehensive metabolic panel     Status: None   Collection Time: 12/26/22  5:35 PM  Result Value Ref Range   Sodium 137 135 - 145 mmol/L   Potassium 3.6 3.5 - 5.1 mmol/L   Chloride 103 98 - 111 mmol/L   CO2 24 22 - 32 mmol/L   Glucose, Bld 82 70 - 99 mg/dL    Comment: Glucose reference range applies only to samples taken after fasting for at least 8 hours.   BUN 7 6 - 20 mg/dL   Creatinine, Ser 4.09 0.44 - 1.00 mg/dL   Calcium 9.0 8.9 - 81.1 mg/dL   Total Protein 6.8 6.5 - 8.1 g/dL   Albumin 3.6 3.5 - 5.0 g/dL   AST 20 15 - 41 U/L   ALT 17 0 - 44 U/L   Alkaline Phosphatase 48 38 - 126 U/L   Total Bilirubin 0.5 0.3 - 1.2 mg/dL   GFR, Estimated >91 >47 mL/min    Comment: (NOTE) Calculated using the CKD-EPI Creatinine Equation (2021)    Anion gap 10 5 - 15    Comment: Performed at [facility], [address] [place], [place] [address]  Hemoglobin A1c     Status: None   Collection Time: 12/26/22  5:35 PM  Result Value Ref Range   Hgb A1c MFr Bld 5.5 4.8 - 5.6 %    Comment: (NOTE) Pre diabetes:          5.7%-6.4%  Diabetes:              >6.4%  Glycemic control for   <7.0% adults with diabetes    Mean Plasma Glucose 111.15 mg/dL    Comment: Performed at [facility], [address], [place], [place] [address]  Magnesium     Status: None   Collection Time: 12/26/22  5:35 PM  Result Value Ref Range   Magnesium 1.8 1.7 - 2.4 mg/dL    Comment: Performed at [facility], [address] [place], [place] [address]  Ethanol     Status: None   Collection Time: 12/26/22  5:35 PM  Result Value Ref Range   Alcohol, Ethyl (B) <10 <10 mg/dL    Comment: (NOTE) Lowest detectable limit for serum alcohol is 10 mg/dL.  For medical purposes only. Performed at [facility], [address] [place], [place] [address]   Lipid panel     Status: Abnormal   Collection Time: 12/26/22  5:35 PM  Result Value Ref Range   Cholesterol 205 (H) 0 - 200 mg/dL    Triglycerides 528 <413 mg/dL   HDL 36 (L) >24 mg/dL   Total CHOL/HDL Ratio 5.7 RATIO   VLDL 20 0 - 40 mg/dL   LDL Cholesterol 401 (H) 0 - 99 mg/dL    Comment:        Total Cholesterol/HDL:CHD Risk Coronary Heart Disease Risk Table                     Men   Women  1/2 Average Risk   3.4   3.3  Average Risk       5.0   4.4  2 X Average Risk   9.6   7.1  3 X Average Risk  23.4   11.0        Use the calculated Patient Ratio above and the CHD Risk Table to determine the patient's CHD Risk.        ATP III CLASSIFICATION (LDL):  <100     mg/dL   Optimal  027-253  mg/dL  Near or Above                    Optimal  130-159  mg/dL   Borderline  638-756  mg/dL   High  >433     mg/dL   Very High Performed at [facility], [address], [place], [place] [address]   TSH     Status: None   Collection Time: 12/26/22  5:35 PM  Result Value Ref Range   TSH 2.598 0.350 - 4.500 uIU/mL    Comment: Performed by a 3rd Generation assay with a functional sensitivity of <=0.01 uIU/mL. Performed at [facility], [address] [place], [place] [address]   POC urine preg, ED     Status: None   Collection Time: 12/26/22  5:55 PM  Result Value Ref Range   Preg Test, Ur Negative Negative  POCT Urine Drug Screen - (I-Screen)     Status: Normal   Collection Time: 12/26/22  5:55 PM  Result Value Ref Range   POC Amphetamine UR None Detected NONE DETECTED (Cut Off Level 1000 ng/mL)   POC Secobarbital (BAR) None Detected NONE DETECTED (Cut Off Level 300 ng/mL)   POC Buprenorphine (BUP) None Detected NONE DETECTED (Cut Off Level 10 ng/mL)   POC Oxazepam (BZO) None Detected NONE DETECTED (Cut Off Level 300 ng/mL)   POC Cocaine UR None Detected NONE DETECTED (Cut Off Level 300 ng/mL)   POC Methamphetamine UR None Detected NONE DETECTED (Cut Off Level 1000 ng/mL)   POC Morphine None Detected NONE DETECTED (Cut Off Level 300 ng/mL)   POC Methadone UR None Detected NONE DETECTED (Cut Off Level 300  ng/mL)   POC Oxycodone UR None Detected NONE DETECTED (Cut Off Level 100 ng/mL)   POC Marijuana UR None Detected NONE DETECTED (Cut Off Level 50 ng/mL)  Urinalysis, Complete w Microscopic -Urine, Clean Catch     Status: Abnormal   Collection Time: 12/26/22  6:45 PM  Result Value Ref Range   Color, Urine YELLOW YELLOW   APPearance HAZY (A) CLEAR   Specific Gravity, Urine 1.026 1.005 - 1.030   pH 5.0 5.0 - 8.0   Glucose, UA NEGATIVE NEGATIVE mg/dL   Hgb urine dipstick SMALL (A) NEGATIVE   Bilirubin Urine NEGATIVE NEGATIVE   Ketones, ur 5 (A) NEGATIVE mg/dL   Protein, ur NEGATIVE NEGATIVE mg/dL   Nitrite NEGATIVE NEGATIVE   Leukocytes,Ua NEGATIVE NEGATIVE   RBC / HPF 0-5 0 - 5 RBC/hpf   WBC, UA 0-5 0 - 5 WBC/hpf   Bacteria, UA NONE SEEN NONE SEEN   Squamous Epithelial / HPF 0-5 0 - 5 /HPF   Mucus PRESENT     Comment: Performed at [facility], [address], [place], [place] [address]    Blood Alcohol level:  Lab Results  Component Value Date   ETH <10 12/26/2022    Metabolic Disorder Labs: Lab Results  Component Value Date   HGBA1C 5.5 12/26/2022   MPG 111.15 12/26/2022   No results found for: "PROLACTIN" Lab Results  Component Value Date   CHOL 205 (H) 12/26/2022   TRIG 100 12/26/2022   HDL 36 (L) 12/26/2022   CHOLHDL 5.7 12/26/2022   VLDL 20 12/26/2022   LDLCALC 149 (H) 12/26/2022     Musculoskeletal: Strength & Muscle Tone: within normal limits Gait & Station: normal Patient leans: N/A   Psychiatric Specialty Exam:   Presentation  General Appearance:  Appropriate for Environment  Eye Contact: Good   Speech: Clear and Coherent   Speech Volume: Normal   Handedness: Right     Mood and Affect  Mood: "Good"   Affect: Stable, full range     Thought Process  Thought Processes: Coherent; Goal Directed   Descriptions of Associations:Intact   Orientation:Full (Time, Place and Person)   Thought Content:Abstract Reasoning    History of Schizophrenia/Schizoaffective disorder:No   Duration of Psychotic Symptoms:N/A Hallucinations:Hallucinations: None   Ideas of Reference:None   Suicidal Thoughts:Suicidal Thoughts: No   Homicidal Thoughts:Homicidal Thoughts: No     Sensorium  Memory: Immediate Good; Recent Good; Remote Good   Judgment: Improving   Insight: Shallow     Executive Functions  Concentration: Good   Attention Span: Fair   Recall: [employer] of Knowledge: Fair   Language: Fair     Psychomotor Activity  Psychomotor Activity: Normal     Agricultural engineer; Desire for Improvement; Social Support; Vocational/Educational; Housing     Sleep  Sleep: Fair   Physical Exam:  Constitutional:      Appearance: Normal appearance.  HENT:     Head: Normocephalic and atraumatic.     Nose: Nose normal.  Eyes:     Pupils: Pupils are equal, round, and reactive to light.  Cardiovascular:     Rate and Rhythm: Normal rate.     Pulses: Normal pulses.  Pulmonary:     Effort: Pulmonary effort is normal.  Musculoskeletal:     Cervical back: Normal range of motion.  Skin:    General: Skin is warm.  Neurological:     General: No focal deficit present.     Mental Status: She is alert and oriented to person, place, and time.      Review of Systems  Constitutional:   Negative for fever.  HENT: Negative.    Eyes: Negative.   Respiratory: Negative.    Cardiovascular: Negative.   Gastrointestinal: Negative.   Genitourinary: Negative.   Musculoskeletal: Negative.   Skin: Negative.   Neurological: Negative.    Blood pressure (!) 132/97, pulse (!) 108, temperature 99.1 F (37.3 C), temperature source Oral, resp. rate 19, height 5\' 6"  (1.676 m), weight 125.2 kg, SpO2 98%. Body mass index is 44.55 kg/m.   Treatment Plan Summary: Daily contact with patient to assess and evaluate symptoms and progress in treatment and Medication management  Physician Treatment Plan  for Primary Diagnosis: MDD (major depressive disorder), recurrent episode, severe (HCC) Anxiety disorder   Patient is admitted to locked unit under suicide precautions -Continue on Lexapro 20 mg by mouth daily  -Continue on BuSpar to 5 mg 3 times daily to help with anxiety -social worker consulted to get collateral and help with a safe discharge plan -Encouraged the patient to attend group and work on coping strategies  ELOS 1-2 days  Lewanda Rife, MD

## 2022-12-28 NOTE — Progress Notes (Signed)
Patient is alert and oriented x 4, affect is blunted thoughts are organized, she was noted interacting appropriately with peers and staff, complaint with medication regimen. Patient denies SI/HI/AVH, patient was offered emotional support and encouragement. 15 minutes safety checks maintained will continue to monitor closely,

## 2022-12-28 NOTE — BH IP Treatment Plan (Signed)
Interdisciplinary Treatment and Diagnostic Plan Update  12/28/2022 Time of Session: 9:44 AM  Allison Tapia MRN: [id number]  Principal Diagnosis: MDD (major depressive disorder), recurrent severe, without psychosis (HCC)  Secondary Diagnoses: Principal Problem:   MDD (major depressive disorder), recurrent severe, without psychosis (HCC) Active Problems:   Anxiety disorder   Current Medications:  Current Facility-Administered Medications  Medication Dose Route Frequency Provider Last Rate Last Admin   acetaminophen (TYLENOL) tablet 650 mg  650 mg Oral Q6H PRN Ajibola, Ene A, NP       alum & mag hydroxide-simeth (MAALOX/MYLANTA) 200-200-20 MG/5ML suspension 30 mL  30 mL Oral Q4H PRN Ajibola, Ene A, NP       busPIRone (BUSPAR) tablet 5 mg  5 mg Oral TID Lewanda Rife, MD   5 mg at 12/28/22 4098   diphenhydrAMINE (BENADRYL) capsule 50 mg  50 mg Oral TID PRN Ajibola, Ene A, NP       Or   diphenhydrAMINE (BENADRYL) injection 50 mg  50 mg Intramuscular TID PRN Ajibola, Ene A, NP       escitalopram (LEXAPRO) tablet 20 mg  20 mg Oral Daily Lewanda Rife, MD   20 mg at 12/28/22 1191   haloperidol (HALDOL) tablet 5 mg  5 mg Oral TID PRN Ajibola, Ene A, NP       Or   haloperidol lactate (HALDOL) injection 5 mg  5 mg Intramuscular TID PRN Ajibola, Ene A, NP       hydrOXYzine (ATARAX) tablet 25 mg  25 mg Oral TID PRN Ajibola, Ene A, NP   25 mg at 12/27/22 2112   LORazepam (ATIVAN) tablet 2 mg  2 mg Oral TID PRN Ajibola, Ene A, NP       Or   LORazepam (ATIVAN) injection 2 mg  2 mg Intramuscular TID PRN Ajibola, Ene A, NP       magnesium hydroxide (MILK OF MAGNESIA) suspension 30 mL  30 mL Oral Daily PRN Ajibola, Ene A, NP       traZODone (DESYREL) tablet 50 mg  50 mg Oral QHS PRN Ajibola, Ene A, NP       PTA Medications: Medications Prior to Admission  Medication Sig Dispense Refill Last Dose   busPIRone (BUSPAR) 5 MG tablet Take 5 mg by mouth daily.      escitalopram (LEXAPRO) 10 MG  tablet Take 10 mg by mouth every morning.      metroNIDAZOLE (FLAGYL) 500 MG tablet Take 1 tablet (500 mg total) by mouth 2 (two) times daily. 14 tablet 0    naproxen (NAPROSYN) 500 MG tablet Take 1 tablet (500 mg total) by mouth 2 (two) times daily with a meal. (Patient not taking: Reported on 06/26/2022) 20 tablet 0    ondansetron (ZOFRAN-ODT) 8 MG disintegrating tablet Take 1 tablet (8 mg total) by mouth every 8 (eight) hours as needed for nausea or vomiting. (Patient not taking: Reported on 06/26/2022) 10 tablet 0     Patient Stressors: Medication change or noncompliance   Occupational concerns    Patient Strengths: Ability for insight  Motivation for treatment/growth   Treatment Modalities: Medication Management, Group therapy, Case management,  1 to 1 session with clinician, Psychoeducation, Recreational therapy.   Physician Treatment Plan for Primary Diagnosis: MDD (major depressive disorder), recurrent severe, without psychosis (HCC) Long Term Goal(s): Improvement in symptoms so as ready for discharge   Short Term Goals: Ability to identify changes in lifestyle to reduce recurrence of condition will improve Ability to verbalize  feelings will improve Ability to disclose and discuss suicidal ideas Ability to demonstrate self-control will improve Ability to identify and develop effective coping behaviors will improve Ability to maintain clinical measurements within normal limits will improve Ability to identify triggers associated with substance abuse/mental health issues will improve  Medication Management: Evaluate patient's response, side effects, and tolerance of medication regimen.  Therapeutic Interventions: 1 to 1 sessions, Unit Group sessions and Medication administration.  Evaluation of Outcomes: Progressing  Physician Treatment Plan for Secondary Diagnosis: Principal Problem:   MDD (major depressive disorder), recurrent severe, without psychosis (HCC) Active Problems:    Anxiety disorder  Long Term Goal(s): Improvement in symptoms so as ready for discharge   Short Term Goals: Ability to identify changes in lifestyle to reduce recurrence of condition will improve Ability to verbalize feelings will improve Ability to disclose and discuss suicidal ideas Ability to demonstrate self-control will improve Ability to identify and develop effective coping behaviors will improve Ability to maintain clinical measurements within normal limits will improve Ability to identify triggers associated with substance abuse/mental health issues will improve     Medication Management: Evaluate patient's response, side effects, and tolerance of medication regimen.  Therapeutic Interventions: 1 to 1 sessions, Unit Group sessions and Medication administration.  Evaluation of Outcomes: Progressing   RN Treatment Plan for Primary Diagnosis: MDD (major depressive disorder), recurrent severe, without psychosis (HCC) Long Term Goal(s): Knowledge of disease and therapeutic regimen to maintain health will improve  Short Term Goals: Ability to remain free from injury will improve, Ability to verbalize frustration and anger appropriately will improve, Ability to demonstrate self-control, Ability to participate in decision making will improve, Ability to verbalize feelings will improve, Ability to disclose and discuss suicidal ideas, Ability to identify and develop effective coping behaviors will improve, and Compliance with prescribed medications will improve  Medication Management: RN will administer medications as ordered by provider, will assess and evaluate patient's response and provide education to patient for prescribed medication. RN will report any adverse and/or side effects to prescribing provider.  Therapeutic Interventions: 1 on 1 counseling sessions, Psychoeducation, Medication administration, Evaluate responses to treatment, Monitor vital signs and CBGs as ordered,  Perform/monitor CIWA, COWS, AIMS and Fall Risk screenings as ordered, Perform wound care treatments as ordered.  Evaluation of Outcomes: Progressing   LCSW Treatment Plan for Primary Diagnosis: MDD (major depressive disorder), recurrent severe, without psychosis (HCC) Long Term Goal(s): Safe transition to appropriate next level of care at discharge, Engage patient in therapeutic group addressing interpersonal concerns.  Short Term Goals: Engage patient in aftercare planning with referrals and resources, Increase social support, Increase ability to appropriately verbalize feelings, Increase emotional regulation, Facilitate acceptance of mental health diagnosis and concerns, and Increase skills for wellness and recovery  Therapeutic Interventions: Assess for all discharge needs, 1 to 1 time with Social worker, Explore available resources and support systems, Assess for adequacy in community support network, Educate family and significant other(s) on suicide prevention, Complete Psychosocial Assessment, Interpersonal group therapy.  Evaluation of Outcomes: Progressing   Progress in Treatment: Attending groups: Yes. Participating in groups: Yes. Taking medication as prescribed: Yes. Toleration medication: Yes. Family/Significant other contact made: No, will contact:  CSW will contact if given permission  Patient understands diagnosis: Yes. Discussing patient identified problems/goals with staff: Yes. Medical problems stabilized or resolved: Yes. Denies suicidal/homicidal ideation: Yes. Issues/concerns per patient self-inventory: No. Other: None   New problem(s) identified: No, Describe:  None identified   New Short Term/Long Term Goal(s):  elimination of symptoms of psychosis, medication management for mood stabilization; elimination of SI thoughts; development of comprehensive mental wellness plan.   Patient Goals: "no, I don't have any"    Discharge Plan or Barriers: CSW will assist  with appropriate discharge planning   Reason for Continuation of Hospitalization: Depression Medication stabilization  Estimated Length of Stay: 1 to 7 days   Last 3 [place] Suicide Severity Risk Score: Flowsheet Row Admission (Current) from 12/27/2022 in [facility] ED from 12/26/2022 in [facility] ED from 06/26/2022 in [facility] Department at [facility]  C-SSRS RISK CATEGORY No Risk No Risk No Risk       Last [facility] 2/9 Scores:     No data to display          Scribe for Treatment Team: Elza Rafter, Theresia Majors 12/28/2022 11:48 AM

## 2022-12-28 NOTE — Group Note (Signed)
Date:  12/28/2022 Time:  5:45 PM  Group Topic/Focus:  Activity Group:  The focus of this group is to encourage patients to go outside to the courtyard and get some fresh air and some exercise as well.    Participation Level:  Active  Participation Quality:  Appropriate  Affect:  Appropriate  Cognitive:  Appropriate  Insight: Appropriate  Engagement in Group:  Engaged  Modes of Intervention:  Activity  Additional Comments:    Allison Tapia Allison Tapia 12/28/2022, 5:45 PM

## 2022-12-28 NOTE — Progress Notes (Signed)
Pt calm and pleasant during assessment denying SI/HI/AVH. Pt observed interacting appropriately with staff and peers on the unit. Pt didn't have any scheduled medications and hasn't requested anything PRN. Pt given education, support, and encouragement to be active in her treatment plan. Pt being monitored Q 15 minutes for safety per unit protocol, remains safe on the unit

## 2022-12-28 NOTE — Plan of Care (Signed)
Patient rated her depression and anxiety 0/10. Patient stated that she feels better. Pleasant and cooperative on approach. Visible in the milieu. Appropriate with staff & peers. Denies SI,HI and AVH. Appetite and energy level good. Support and encouragement given.

## 2022-12-28 NOTE — Group Note (Signed)
Date:  12/28/2022 Time:  9:29 PM  Group Topic/Focus:  Wellness Toolbox:   The focus of this group is to discuss various aspects of wellness, balancing those aspects and exploring ways to increase the ability to experience wellness.  Patients will create a wellness toolbox for use upon discharge.    Participation Level:  Active  Participation Quality:  Appropriate  Affect:  Appropriate  Cognitive:  Appropriate  Insight: Good  Engagement in Group:  Engaged  Modes of Intervention:  Discussion  Additional Comments:    Lenore Cordia 12/28/2022, 9:29 PM

## 2022-12-28 NOTE — Plan of Care (Signed)
 Pt denies SI/HI/AVH, compliant with procedures on the unit  Problem: Education: Goal: Knowledge of General Education information will improve Description: Including pain rating scale, medication(s)/side effects and non-pharmacologic comfort measures Outcome: Progressing   Problem: Health Behavior/Discharge Planning: Goal: Ability to manage health-related needs will improve Outcome: Progressing   Problem: Clinical Measurements: Goal: Ability to maintain clinical measurements within normal limits will improve Outcome: Progressing Goal: Will remain free from infection Outcome: Progressing Goal: Diagnostic test results will improve Outcome: Progressing Goal: Respiratory complications will improve Outcome: Progressing Goal: Cardiovascular complication will be avoided Outcome: Progressing   Problem: Activity: Goal: Risk for activity intolerance will decrease Outcome: Progressing   Problem: Nutrition: Goal: Adequate nutrition will be maintained Outcome: Progressing   Problem: Coping: Goal: Level of anxiety will decrease Outcome: Progressing   Problem: Elimination: Goal: Will not experience complications related to bowel motility Outcome: Progressing Goal: Will not experience complications related to urinary retention Outcome: Progressing   Problem: Pain Managment: Goal: General experience of comfort will improve Outcome: Progressing   Problem: Safety: Goal: Ability to remain free from injury will improve Outcome: Progressing   Problem: Skin Integrity: Goal: Risk for impaired skin integrity will decrease Outcome: Progressing   Problem: Education: Goal: Knowledge of [place] [facility] Education information/materials will improve Outcome: Progressing Goal: Emotional status will improve Outcome: Progressing Goal: Mental status will improve Outcome: Progressing Goal: Verbalization of understanding the information provided will improve Outcome: Progressing    Problem: Activity: Goal: Interest or engagement in activities will improve Outcome: Progressing Goal: Sleeping patterns will improve Outcome: Progressing   Problem: Coping: Goal: Ability to verbalize frustrations and anger appropriately will improve Outcome: Progressing Goal: Ability to demonstrate self-control will improve Outcome: Progressing   Problem: Health Behavior/Discharge Planning: Goal: Identification of resources available to assist in meeting health care needs will improve Outcome: Progressing Goal: Compliance with treatment plan for underlying cause of condition will improve Outcome: Progressing   Problem: Physical Regulation: Goal: Ability to maintain clinical measurements within normal limits will improve Outcome: Progressing   Problem: Safety: Goal: Periods of time without injury will increase Outcome: Progressing

## 2022-12-28 NOTE — Group Note (Signed)
Date:  12/28/2022 Time:  3:37 PM  Group Topic/Focus:   Goals Group:   The focus of this group is to help patients establish daily goals to achieve during treatment and discuss how the patient can incorporate goal setting into their daily lives to aide in recovery.   Participation Level:  Active  Participation Quality:  Appropriate and Attentive  Affect:  Appropriate  Cognitive:  Alert and Appropriate  Insight: Appropriate  Engagement in Group:  Engaged  Modes of Intervention:  Activity, Discussion, and Education  Additional Comments:    Eboney Claybrook A Autym Siess 12/28/2022, 3:37 PM

## 2022-12-29 DIAGNOSIS — F332 Major depressive disorder, recurrent severe without psychotic features: Secondary | ICD-10-CM | POA: Diagnosis not present

## 2022-12-29 NOTE — Group Note (Signed)
Date:  12/29/2022 Time:  9:04 PM  Group Topic/Focus:  Overcoming Stress:   The focus of this group is to define stress and help patients assess their triggers.    Participation Level:  Active  Participation Quality:  Appropriate and Attentive  Affect:  Appropriate  Cognitive:  Alert and Appropriate  Insight: Good  Engagement in Group:  Developing/Improving  Modes of Intervention:  Education and Limit-setting  Additional Comments:     Jamicheal Heard 12/29/2022, 9:04 PM

## 2022-12-29 NOTE — Group Note (Signed)
[facility] LCSW Group Therapy Note   Group Date: 12/29/2022 Start Time: 1300 End Time: 1400  Type of Therapy/Topic:  Group Therapy:  Feelings about Diagnosis  Participation Level:  Active     Description of Group:    This group will allow patients to explore their thoughts and feelings about diagnoses they have received. Patients will be guided to explore their level of understanding and acceptance of these diagnoses. Facilitator will encourage patients to process their thoughts and feelings about the reactions of others to their diagnosis, and will guide patients in identifying ways to discuss their diagnosis with significant others in their lives. This group will be process-oriented, with patients participating in exploration of their own experiences as well as giving and receiving support and challenge from other group members.   Therapeutic Goals: 1. Patient will demonstrate understanding of diagnosis as evidence by identifying two or more symptoms of the disorder:  2. Patient will be able to express two feelings regarding the diagnosis 3. Patient will demonstrate ability to communicate their needs through discussion and/or role plays  Summary of Patient Progress: Patient was present for the entirety of the group process. She was actively involved in the conversation. Pt was able to speak her issues within her own support system. Although her comments were limited, when provided they were pertinent to the discussion. Insight into the topic is questionable. She appeared open and receptive to comments/feedback from both her peers and facilitator.    Therapeutic Modalities:   Cognitive Behavioral Therapy Brief Therapy Feelings Identification    Glenis Smoker, LCSW
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
## 2022-12-29 NOTE — Group Note (Signed)
Date:  12/29/2022 Time:  6:51 PM  Group Topic/Focus:  Outdoor Recreation Structured Activity.    Participation Level:  Active  Participation Quality:  Appropriate and Attentive  Affect:  Appropriate and Excited  Cognitive:  Alert and Appropriate  Insight: Appropriate  Engagement in Group:  Developing/Improving and Engaged  Modes of Intervention:  Activity and Socialization  Additional Comments:    Rosaura Carpenter 12/29/2022, 6:51 PM

## 2022-12-29 NOTE — Progress Notes (Signed)
Patient pleasant and cooperative with care. Medication compliant. Appropriate with staff and peers. Denies SI, HI, AVH. Did not request prn this shift. Visible in dayroom socializing with peers. Voiced no concerns or complaints. Encouragement and support provided. Safety checks maintained. Medications given as prescribed. Pt receptive and remains safe on unit with q 15 min checks.

## 2022-12-29 NOTE — Group Note (Signed)
Date:  12/29/2022 Time:  4:03 PM  Group Topic/Focus:  Arts and creative therapies are treatments which involve creative activities within therapy sessions.    Participation Level:  Active  Participation Quality:  Appropriate and Attentive  Affect:  Appropriate  Cognitive:  Alert and Appropriate  Insight: Appropriate  Engagement in Group:  Developing/Improving and Engaged  Modes of Intervention:  Activity, Education, Exploration, and Socialization  Additional Comments:    Rosaura Carpenter 12/29/2022, 4:03 PM

## 2022-12-29 NOTE — Progress Notes (Signed)
[facility] MD Progress Note  12/29/2022 2:20 PM Allison Tapia  MRN:  [id number]  Subjective:  Pt chart reviewed, discussed with interdisciplinary team, and seen on rounds. Pt reports mood today is "doing good". Denies suicidal, homicidal ideations. Denies auditory visual hallucinations or paranoia. Pt reports she was admitted after a challenging week at work and poor sleep. Reports she has had good sleep and appetite since admission. Pt reports she lives with her 36 y/o daughter. She states the plan is for her to move in with her sister, Archie Patten, following discharge. With pt's verbal consent, we attempted to call Tonya together at [phone]. Phone went to voicemail. Pt states her mother Lyla Son has been communicating with the both of them and can confirm discharge plan. With pt verbal consent Lyla Son was called at [phone]. Lyla Son confirmed that pt and pt's daughter will be living with [place]. She states she plans on staying with pt and pt's daughter until then. Lyla Son denies safety concerns with pt discharge tomorrow. Lyla Son and pt are in agreement with plan for discharge tomorrow. Pt states she is seen for counseling at [facility] and for medication management at [facility]. She has been with her therapist and psychiatric provider for "a long time". She reports good rapport with both and would like to stay with both at discharge.   Principal Problem: MDD (major depressive disorder), recurrent severe, without psychosis (HCC)  Diagnosis: Principal Problem:   MDD (major depressive disorder), recurrent severe, without psychosis (HCC) Active Problems:   Anxiety disorder  Total Time spent with patient: 30 minutes  Past Psychiatric History: Patient reports history of depression and anxiety.  She takes Lexapro and BuSpar.  She denies past history of suicide attempt or inpatient psychiatric treatment.   Past Medical History:  Past Medical History:  Diagnosis Date   Anxiety    Depression    History  reviewed. No pertinent surgical history. Family History: History reviewed. No pertinent family history. Family Psychiatric  History: None reported Social History:  Social History   Substance and Sexual Activity  Alcohol Use Not Currently     Social History   Substance and Sexual Activity  Drug Use Never    Social History   Socioeconomic History   Marital status: Single    Spouse name: Not on file   Number of children: Not on file   Years of education: Not on file   Highest education level: Not on file  Occupational History   Not on file  Tobacco Use   Smoking status: Never   Smokeless tobacco: Never  Vaping Use   Vaping status: Never Used  Substance and Sexual Activity   Alcohol use: Not Currently   Drug use: Never   Sexual activity: Yes  Other Topics Concern   Not on file  Social History Narrative   Not on file   Social Determinants of Health   Financial Resource Strain: Medium Risk (11/18/2022)   Received from [facility]   Overall Financial Resource Strain (CARDIA)    Difficulty of Paying Living Expenses: Somewhat hard  Food Insecurity: No Food Insecurity (12/27/2022)   Hunger Vital Sign    Worried About Running Out of Food in the Last Year: Never true    Ran Out of Food in the Last Year: Never true  Transportation Needs: No Transportation Needs (12/27/2022)   PRAPARE - Administrator, Civil Service (Medical): No    Lack of Transportation (Non-Medical): No  Physical Activity: Unknown (11/18/2022)   Received  from [facility]   Exercise Vital Sign    Days of Exercise per Week: 3 days    Minutes of Exercise per Session: Not on file  Stress: Stress Concern Present (11/18/2022)   Received from [facility] - Occupational Stress Questionnaire    Feeling of Stress : Very much  Social Connections: Unknown (07/29/2022)   Received from [facility]   Social Network    Social Network: Not on file   Sleep:  Good  Appetite:  Good  Current Medications: Current Facility-Administered Medications  Medication Dose Route Frequency Provider Last Rate Last Admin   acetaminophen (TYLENOL) tablet 650 mg  650 mg Oral Q6H PRN Ajibola, Ene A, NP       alum & mag hydroxide-simeth (MAALOX/MYLANTA) 200-200-20 MG/5ML suspension 30 mL  30 mL Oral Q4H PRN Ajibola, Ene A, NP       busPIRone (BUSPAR) tablet 5 mg  5 mg Oral TID Lewanda Rife, MD   5 mg at 12/29/22 1318   diphenhydrAMINE (BENADRYL) capsule 50 mg  50 mg Oral TID PRN Ajibola, Ene A, NP       Or   diphenhydrAMINE (BENADRYL) injection 50 mg  50 mg Intramuscular TID PRN Ajibola, Ene A, NP       escitalopram (LEXAPRO) tablet 20 mg  20 mg Oral Daily Lewanda Rife, MD   20 mg at 12/29/22 0816   haloperidol (HALDOL) tablet 5 mg  5 mg Oral TID PRN Ajibola, Ene A, NP       Or   haloperidol lactate (HALDOL) injection 5 mg  5 mg Intramuscular TID PRN Ajibola, Ene A, NP       hydrOXYzine (ATARAX) tablet 25 mg  25 mg Oral TID PRN Ajibola, Ene A, NP   25 mg at 12/27/22 2112   LORazepam (ATIVAN) tablet 2 mg  2 mg Oral TID PRN Ajibola, Ene A, NP       Or   LORazepam (ATIVAN) injection 2 mg  2 mg Intramuscular TID PRN Ajibola, Ene A, NP       magnesium hydroxide (MILK OF MAGNESIA) suspension 30 mL  30 mL Oral Daily PRN Ajibola, Ene A, NP       traZODone (DESYREL) tablet 50 mg  50 mg Oral QHS PRN Ajibola, Ene A, NP        Lab Results: No results found for this or any previous visit (from the past 48 hour(s)).  Blood Alcohol level:  Lab Results  Component Value Date   ETH <10 12/26/2022    Metabolic Disorder Labs: Lab Results  Component Value Date   HGBA1C 5.5 12/26/2022   MPG 111.15 12/26/2022   No results found for: "PROLACTIN" Lab Results  Component Value Date   CHOL 205 (H) 12/26/2022   TRIG 100 12/26/2022   HDL 36 (L) 12/26/2022   CHOLHDL 5.7 12/26/2022   VLDL 20 12/26/2022   LDLCALC 149 (H) 12/26/2022    Physical Findings: AIMS:  ,  ,  ,  ,    CIWA:    COWS:     Musculoskeletal: Strength & Muscle Tone: within normal limits Gait & Station: normal Patient leans: N/A  Psychiatric Specialty Exam:  Presentation  General Appearance:  Appropriate for Environment  Eye Contact: Good  Speech: Clear and Coherent  Speech Volume: Normal  Handedness: Right   Mood and Affect  Mood: -- ("doing good")  Affect: Blunt   Thought Process  Thought Processes: Coherent; Goal  Directed; Linear  Descriptions of Associations:Intact  Orientation:Full (Time, Place and Person)  Thought Content:Logical  History of Schizophrenia/Schizoaffective disorder:No  Hallucinations:Hallucinations: None  Ideas of Reference:None  Suicidal Thoughts:Suicidal Thoughts: No  Homicidal Thoughts:Homicidal Thoughts: No   Sensorium  Memory: Immediate Good; Remote Good; Recent Good  Judgment: Fair  Insight: Fair   Art therapist  Concentration: Good  Attention Span: Good  Recall: Good  Fund of Knowledge: Good  Language: Good   Psychomotor Activity  Psychomotor Activity:Psychomotor Activity: Normal   Assets  Assets: Communication Skills; Desire for Improvement; Financial Resources/Insurance; Housing; Physical Health; Resilience; Social Support   Sleep  Sleep:Sleep: Good    Physical Exam: Physical Exam Constitutional:      General: She is not in acute distress.    Appearance: She is not ill-appearing, toxic-appearing or diaphoretic.  Eyes:     General: No scleral icterus. Cardiovascular:     Rate and Rhythm: Normal rate.  Pulmonary:     Effort: Pulmonary effort is normal. No respiratory distress.  Neurological:     Mental Status: She is alert and oriented to person, place, and time.  Psychiatric:        Attention and Perception: Attention and perception normal.        Mood and Affect: Mood normal. Affect is blunt.        Speech: Speech normal.        Behavior: Behavior normal.  Behavior is cooperative.        Thought Content: Thought content normal.        Cognition and Memory: Cognition and memory normal.        Judgment: Judgment normal.    Review of Systems  Constitutional:  Negative for chills and fever.  Respiratory:  Negative for shortness of breath.   Cardiovascular:  Negative for chest pain and palpitations.  Gastrointestinal:  Negative for abdominal pain.  Neurological:  Negative for headaches.  Psychiatric/Behavioral: Negative.     Blood pressure (!) 140/98, pulse 85, temperature 98.1 F (36.7 C), resp. rate (!) 23, height 5\' 6"  (1.676 m), weight 125.2 kg, SpO2 95%. Body mass index is 44.55 kg/m.  Treatment Plan Summary: Daily contact with patient to assess and evaluate symptoms and progress in treatment, Medication management, and Plan    MDD -Lexapro 20mg  oral daily  Anxiety disorder -Buspar 5mg  oral 3 times daily  PRNs -Tylenol 650mg  oral every 6 hours PRN mild pain -Maalox/Mylanta 30mL oral every 4 hours PRN indigestion -Benadryl 50mg  oral or IM 3 times daily PRN agitation -Haldol 5mg  oral or IM 3 times daily PRN agitation -Ativan 2mg  oral 3 times daily PRN agitation -Hydroxyzine 25mg  oral 3 times daily PRN anxiety -Milk of Magnesia 30mL oral daily PRN mild constipation -Trazodone 50mg  oral at bedtime PRN sleep  Lauree Chandler, NP 12/29/2022, 2:20 PM

## 2022-12-29 NOTE — Progress Notes (Signed)
   12/29/22 1100  Spiritual Encounters  Type of Visit Initial  Care provided to: Patient  Referral source Chaplain assessment  Reason for visit Routine spiritual support  OnCall Visit No  Spiritual Framework  Presenting Themes Values and beliefs;Coping tools;Courage hope and growth  Community/Connection Family  Patient Stress Factors Exhausted  Interventions  Spiritual Care Interventions Made Compassionate presence;Established relationship of care and support;Narrative/life review;Encouragement;Self-care teaching  Intervention Outcomes  Outcomes Connection to spiritual care;Awareness around self/spiritual resourses;Reduced anxiety;Patient family open to resources;Autonomy/agency  Spiritual Care Plan  Spiritual Care Issues Still Outstanding No further spiritual care needs at this time (see row info)   Chaplain met with the patient in the hallway after request of SW. Patient shared with the chaplain that she was really exhausted and had not gotten enough rest and that is why she is here. Patient shared with the chaplain that she works 7 days a week and is raising her 36 year old alone. Chaplain talked to the patient about getting rest, self-care, and things that bring her joy. 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 Chaplain offered her encouraging words.

## 2022-12-29 NOTE — Group Note (Signed)
Date:  12/29/2022 Time:  11:31 AM  Group Topic/Focus:  Dimensions of Wellness:   The focus of this group is to introduce the topic of wellness and discuss the role each dimension of wellness plays in total health.    Participation Level:  Active  Participation Quality:  Appropriate and Attentive  Affect:  Appropriate  Cognitive:  Alert, Appropriate, and Oriented  Insight: Appropriate  Engagement in Group:  Developing/Improving and Engaged  Modes of Intervention:  Activity, Discussion, Education, Socialization, and Support  Additional Comments:    Rosaura Carpenter 12/29/2022, 11:31 AM

## 2022-12-29 NOTE — [facility] Suicide Risk Assessment (Signed)
[facility] INPATIENT:  Family/Significant Other Suicide Prevention Education  Suicide Prevention Education:  Education Completed; Eillen Couchman, mom [phone] has been identified by the patient as the family member/significant other with whom the patient will be residing, and identified as the person(s) who will aid the patient in the event of a mental health crisis (suicidal ideations/suicide attempt).  With written consent from the patient, the family member/significant other has been provided the following suicide prevention education, prior to the and/or following the discharge of the patient.  The suicide prevention education provided includes the following: Suicide risk factors Suicide prevention and interventions National Suicide Hotline telephone number [facility] assessment telephone number [facility] Emergency Assistance 911 [facility] and/or Residential Mobile Crisis Unit telephone number  Request made of family/significant other to: Remove weapons (e.g., guns, rifles, knives), all items previously/currently identified as safety concern.   Remove drugs/medications (over-the-counter, prescriptions, illicit drugs), all items previously/currently identified as a safety concern.  The family member/significant other verbalizes understanding of the suicide prevention education information provided.  The family member/significant other agrees to remove the items of safety concern listed above.  Mother reports "I don't want her to be by herself.  I plan on being at her house when she gets out tomorrow".  She reports that patient does not have access to weapons at this time. She reports that the patient is not a danger to self or others.    Harden Mo 12/29/2022, 2:28 PM

## 2022-12-29 NOTE — Group Note (Signed)
Recreation Therapy Group Note   Group Topic:Relaxation  Group Date: 12/29/2022 Start Time: 1000 End Time: 1100 Facilitators: Rosina Lowenstein, LRT, CTRS Location: Courtyard  Group Description: Meditation. LRT and patients discussed what they know about meditation and mindfulness. LRT played a Deep Breathing Meditation exercise script for patients to follow along to. LRT and patients discussed how meditation and deep breathing can be used as a coping skill post--discharge. After the meditation, LRT took song requests from pts to listen to while playing corn hole, getting fresh air, and sunlight.   Goal Area(s) Addressed: Patient will practice using relaxation technique. Patient will identify a new coping skill.  Patient will follow multistep directions to reduce anxiety and stress.   Affect/Mood: Appropriate   Participation Level: Active and Engaged   Participation Quality: Independent   Behavior: Appropriate, Calm, and Cooperative   Speech/Thought Process: Coherent   Insight: Good   Judgement: Good   Modes of Intervention: Activity   Patient Response to Interventions:  Attentive, Engaged, Interested , and Receptive   Education Outcome:  Acknowledges education   Clinical Observations/Individualized Feedback: Allison Tapia was active in their participation of session activities and group discussion. Pt shared that she has done meditation before and has found it helpful. Pt interacted well with LRT and peers duration of session.   Plan: Continue to engage patient in RT group sessions 2-3x/week.   Rosina Lowenstein, LRT, CTRS 12/29/2022 11:45 AM

## 2022-12-29 NOTE — Plan of Care (Signed)
D- Patient alert and oriented. Patient presented in a pleasant mood on assessment reporting that she slept "good" last night and had no complaints to voice to this Clinical research associate. Patient denied SI, HI, AVH, and pain at this time. Patient also denied any signs/symptoms of depression and anxiety, stating that overall "I feel good". Per her self-inventory, patient's goal for today is to make "plans for going home", in which she will "talk with my family and talk about plans when I go home", in order to achieve her goal.  A- Scheduled medications administered to patient, per MD orders. Support and encouragement provided. Routine safety checks conducted every 15 minutes. Patient informed to notify staff with problems or concerns.  R- No adverse drug reactions noted. Patient contracts for safety at this time. Patient compliant with medications and treatment plan. Patient receptive, calm, and cooperative. Patient interacts well with others on the unit. Patient remains safe at this time.  Problem: Education: Goal: Knowledge of General Education information will improve Description: Including pain rating scale, medication(s)/side effects and non-pharmacologic comfort measures Outcome: Progressing   Problem: Health Behavior/Discharge Planning: Goal: Ability to manage health-related needs will improve Outcome: Progressing   Problem: Clinical Measurements: Goal: Ability to maintain clinical measurements within normal limits will improve Outcome: Progressing Goal: Will remain free from infection Outcome: Progressing Goal: Diagnostic test results will improve Outcome: Progressing Goal: Respiratory complications will improve Outcome: Progressing Goal: Cardiovascular complication will be avoided Outcome: Progressing   Problem: Activity: Goal: Risk for activity intolerance will decrease Outcome: Progressing   Problem: Nutrition: Goal: Adequate nutrition will be maintained Outcome: Progressing   Problem:  Coping: Goal: Level of anxiety will decrease Outcome: Progressing   Problem: Elimination: Goal: Will not experience complications related to bowel motility Outcome: Progressing Goal: Will not experience complications related to urinary retention Outcome: Progressing   Problem: Pain Managment: Goal: General experience of comfort will improve Outcome: Progressing   Problem: Safety: Goal: Ability to remain free from injury will improve Outcome: Progressing   Problem: Skin Integrity: Goal: Risk for impaired skin integrity will decrease Outcome: Progressing   Problem: Education: Goal: Knowledge of [place] [facility] Education information/materials will improve Outcome: Progressing Goal: Emotional status will improve Outcome: Progressing Goal: Mental status will improve Outcome: Progressing Goal: Verbalization of understanding the information provided will improve Outcome: Progressing   Problem: Activity: Goal: Interest or engagement in activities will improve Outcome: Progressing Goal: Sleeping patterns will improve Outcome: Progressing   Problem: Coping: Goal: Ability to verbalize frustrations and anger appropriately will improve Outcome: Progressing Goal: Ability to demonstrate self-control will improve Outcome: Progressing   Problem: Health Behavior/Discharge Planning: Goal: Identification of resources available to assist in meeting health care needs will improve Outcome: Progressing Goal: Compliance with treatment plan for underlying cause of condition will improve Outcome: Progressing   Problem: Physical Regulation: Goal: Ability to maintain clinical measurements within normal limits will improve Outcome: Progressing   Problem: Safety: Goal: Periods of time without injury will increase Outcome: Progressing

## 2022-12-29 NOTE — Plan of Care (Signed)
  Problem: Coping: Goal: Level of anxiety will decrease Outcome: Progressing   Problem: Safety: Goal: Ability to remain free from injury will improve Outcome: Progressing   Problem: Education: Goal: Emotional status will improve Outcome: Progressing Goal: Verbalization of understanding the information provided will improve Outcome: Progressing

## 2022-12-30 DIAGNOSIS — F332 Major depressive disorder, recurrent severe without psychotic features: Secondary | ICD-10-CM | POA: Diagnosis not present

## 2022-12-30 MED ORDER — ESCITALOPRAM OXALATE 20 MG PO TABS: 20.0000 mg | ORAL_TABLET | Freq: Every day | ORAL | 0 refills | Status: AC

## 2022-12-30 MED ORDER — BUSPIRONE HCL 5 MG PO TABS: 5.0000 mg | ORAL_TABLET | Freq: Three times a day (TID) | ORAL | 0 refills | Status: AC

## 2022-12-30 NOTE — Group Note (Signed)
Recreation Therapy Group Note   Group Topic:Goal Setting  Group Date: 12/30/2022 Start Time: 1000 End Time: 1100 Facilitators: Patria Mane Location:  Craft Room  Group Description: Scientist, research (physical sciences). Patients were given many different magazines, a glue stick, markers, and a piece of cardstock paper. LRT and pts discussed the importance of having goals in life. LRT and pts discussed the difference between short-term and long-term goals, as well as what a SMART goal is. LRT encouraged pts to create a vision board, with images they picked and then cut out with safety scissors from the magazine, for themselves, that capture their short and long-term goals. LRT encouraged pts to show and explain their vision board to the group.   Goal Area(s) Addressed:  Patient will gain knowledge of short vs. long term goals.  Patient will identify goals for themselves. Patient will practice setting SMART goals. Patient will verbalize their goals to LRT and peers.    Affect/Mood: Appropriate   Participation Level: Active and Engaged   Participation Quality: Independent   Behavior: Appropriate, Calm, and Cooperative   Speech/Thought Process: Coherent   Insight: Good   Judgement: Good   Modes of Intervention: Art   Patient Response to Interventions:  Attentive, Engaged, Interested , and Receptive   Education Outcome:  Acknowledges education   Clinical Observations/Individualized Feedback: Chitra was active in their participation of session activities and group discussion. Pt identified "I want to travel to [place] and go on a [employer]" as her goals. Pt appropriately found images to show these goals. Pt interacted well with LRT and peers duration of session.   Plan: Continue to engage patient in RT group sessions 2-3x/week.   Rosina Lowenstein, LRT, CTRS 12/30/2022 12:13 PM
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
## 2022-12-30 NOTE — Group Note (Signed)
Date:  12/30/2022 Time:  10:06 AM  Group Topic/Focus:  Goals Group:   The focus of this group is to help patients establish daily goals to achieve during treatment and discuss how the patient can incorporate goal setting into their daily lives to aide in recovery.    Participation Level:  Active  Participation Quality:  Appropriate  Affect:  Appropriate  Cognitive:  Appropriate  Insight: Appropriate  Engagement in Group:  Engaged  Modes of Intervention:  Discussion, Education, and Support  Additional Comments:    Wilford Corner 12/30/2022, 10:06 AM

## 2022-12-30 NOTE — BHH Counselor (Signed)
CSW met with pt briefly to discuss discharge/aftercare plans. Pt plans to return home and her support system will be providing transportation for her to get there. She is connected with [facility] and [facility] for outpatient therapy and medication management. CSW inquired if pt would like them contacted to see about her next appointment/schedule follow up. Pt voiced acceptance of this information. She has clothes to wear home at discharge. Pt denied any use of tobacco products or need for cessation services. She also denied any use of substances or need for substance use based services. When asked if there was anything else that she needs from social work, pt denied any other needs. No other concerns expressed. Contact ended without incident.   CSW contacted [facility] [phone]) and [facility] [phone]). Contact was not able to be established with both and HIPAA compliant voicemails left with contact information for follow through.   Allison Tapia. Algis Greenhouse, MSW, LCSW, LCAS 12/30/2022 10:19 AM

## 2022-12-30 NOTE — Discharge Summary (Signed)
Physician Discharge Summary Note  Patient:  Allison Tapia is an 37 y.o., female MRN:  [id number] DOB:  15-Feb-1986 Patient phone:  [phone] (home)  Patient address:   [address] [place] [address],  Total Time spent with patient: 30 minutes  Date of Admission:  12/27/2022 Date of Discharge: 12/30/2022  Reason for Admission:  36 y/o female admitted for increased depression, suicidal ideations.  Principal Problem: MDD (major depressive disorder), recurrent severe, without psychosis (HCC) Discharge Diagnoses: Principal Problem:   MDD (major depressive disorder), recurrent severe, without psychosis (HCC) Active Problems:   Anxiety disorder  Subjective: Pt chart reviewed, discussed with interdisciplinary team, and seen on rounds. Endorses mood today is "wonderful". Sleep and appetite have been "good". She is looking forward to discharging today. Denies suicidal, homicidal ideations. Denies auditory visual hallucinations or paranoia. We reviewed her discharge medications. She prefers print prescriptions.  Past Psychiatric History: Patient reports history of depression and anxiety. She takes Lexapro and BuSpar. She denies past history of suicide attempt or inpatient psychiatric treatment.  Past Medical History:  Past Medical History:  Diagnosis Date   Anxiety    Depression    History reviewed. No pertinent surgical history. Family History: History reviewed. No pertinent family history. Family Psychiatric  History: None reported Social History:  Social History   Substance and Sexual Activity  Alcohol Use Not Currently     Social History   Substance and Sexual Activity  Drug Use Never    Social History   Socioeconomic History   Marital status: Single    Spouse name: Not on file   Number of children: Not on file   Years of education: Not on file   Highest education level: Not on file  Occupational History   Not on file  Tobacco Use   Smoking status: Never    Smokeless tobacco: Never  Vaping Use   Vaping status: Never Used  Substance and Sexual Activity   Alcohol use: Not Currently   Drug use: Never   Sexual activity: Yes  Other Topics Concern   Not on file  Social History Narrative   Not on file   Social Determinants of Health   Financial Resource Strain: Medium Risk (11/18/2022)   Received from [facility]   Overall Financial Resource Strain (CARDIA)    Difficulty of Paying Living Expenses: Somewhat hard  Food Insecurity: No Food Insecurity (12/27/2022)   Hunger Vital Sign    Worried About Running Out of Food in the Last Year: Never true    Ran Out of Food in the Last Year: Never true  Transportation Needs: No Transportation Needs (12/27/2022)   PRAPARE - Administrator, Civil Service (Medical): No    Lack of Transportation (Non-Medical): No  Physical Activity: Unknown (11/18/2022)   Received from [facility]   Exercise Vital Sign    Days of Exercise per Week: 3 days    Minutes of Exercise per Session: Not on file  Stress: Stress Concern Present (11/18/2022)   Received from [facility] of Occupational [facility] - Occupational Stress Questionnaire    Feeling of Stress : Very much  Social Connections: Unknown (07/29/2022)   Received from [facility]   Social Network    Social Network: Not on file    Hospital Course:  Allison Tapia was admitted for MDD (major depressive disorder), recurrent severe, without psychosis (HCC) and crisis management.  Allison Tapia was discharged with current medication and was instructed  on how to take medications as prescribed; (details listed below under Medication List).  Medical problems were identified and treated as needed.  Home medications were restarted as appropriate.  Improvement was monitored by observation and Allison Tapia daily report of symptom reduction.  Emotional and mental status was monitored by daily self-inventory reports completed by Allison Tapia and clinical staff.         Allison Tapia was evaluated by the treatment team for stability and plans for continued recovery upon discharge.  Allison Tapia motivation was an integral factor for scheduling further treatment.  Employment, transportation, bed availability, health status, family support, and any pending legal issues were also considered during her hospital stay.  She was offered further treatment options upon discharge including but not limited to Residential, Intensive Outpatient, and Outpatient treatment.  Allison Tapia will follow up with the services as listed below under Follow Up Information.     Upon completion of this admission the Allison Tapia was both mentally and medically stable for discharge denying suicidal/homicidal ideation, auditory/visual/tactile hallucinations, delusional thoughts and paranoia.       Physical Findings: AIMS:  , ,  ,  ,    CIWA:    COWS:     Musculoskeletal: Strength & Muscle Tone: within normal limits Gait & Station: normal Patient leans: N/A   Psychiatric Specialty Exam:  Presentation  General Appearance:  Appropriate for Environment; Fairly Groomed  Eye Contact: Good  Speech: Clear and Coherent; Normal Rate  Speech Volume: Normal  Handedness: Right   Mood and Affect  Mood: -- ("wonderful")  Affect: Full Range   Thought Process  Thought Processes: Coherent; Goal Directed; Linear  Descriptions of Associations:Intact  Orientation:Full (Time, Place and Person)  Thought Content:Logical  History of Schizophrenia/Schizoaffective disorder:No  Hallucinations:Hallucinations: None  Ideas of Reference:None  Suicidal Thoughts:Suicidal Thoughts: No  Homicidal Thoughts:Homicidal Thoughts: No   Sensorium  Memory: Immediate Good; Recent Good; Remote Good  Judgment: Fair  Insight: Fair   Art therapist  Concentration: Good  Attention Span: Good  Recall: Good  Fund of  Knowledge: Good  Language: Good   Psychomotor Activity  Psychomotor Activity: Psychomotor Activity: Normal   Assets  Assets: Communication Skills; Desire for Improvement; Financial Resources/Insurance; Housing; Physical Health; Resilience; Social Support   Sleep  Sleep: Sleep: Good    Physical Exam: Physical Exam Constitutional:      General: She is not in acute distress.    Appearance: She is not ill-appearing, toxic-appearing or diaphoretic.  Eyes:     General: No scleral icterus. Cardiovascular:     Rate and Rhythm: Normal rate.  Pulmonary:     Effort: Pulmonary effort is normal. No respiratory distress.  Neurological:     Mental Status: She is alert and oriented to person, place, and time.  Psychiatric:        Attention and Perception: Attention and perception normal.        Mood and Affect: Mood and affect normal.        Speech: Speech normal.        Behavior: Behavior normal. Behavior is cooperative.        Thought Content: Thought content normal.        Cognition and Memory: Cognition and memory normal.        Judgment: Judgment normal.    Review of Systems  Constitutional:  Negative for chills and fever.  Respiratory:  Negative for shortness of breath.   Cardiovascular:  Negative for chest pain and palpitations.  Gastrointestinal:  Negative for abdominal pain.  Neurological:  Negative for headaches.  Psychiatric/Behavioral: Negative.     Blood pressure (!) 149/102, pulse 79, temperature 98 F (36.7 C), resp. rate (!) 21, height 5\' 6"  (1.676 m), weight 125.2 kg, SpO2 100%. Body mass index is 44.55 kg/m.   Social History   Tobacco Use  Smoking Status Never  Smokeless Tobacco Never   Tobacco Cessation:  N/A, patient does not currently use tobacco products   Blood Alcohol level:  Lab Results  Component Value Date   ETH <10 12/26/2022    Metabolic Disorder Labs:  Lab Results  Component Value Date   HGBA1C 5.5 12/26/2022   MPG 111.15  12/26/2022   No results found for: "PROLACTIN" Lab Results  Component Value Date   CHOL 205 (H) 12/26/2022   TRIG 100 12/26/2022   HDL 36 (L) 12/26/2022   CHOLHDL 5.7 12/26/2022   VLDL 20 12/26/2022   LDLCALC 149 (H) 12/26/2022    See Psychiatric Specialty Exam and Suicide Risk Assessment completed by Attending Physician prior to discharge.  Discharge destination:  Home  Is patient on multiple antipsychotic therapies at discharge:  No   Has Patient had three or more failed trials of antipsychotic monotherapy by history:  No  Recommended Plan for Multiple Antipsychotic Therapies: NA   Allergies as of 12/30/2022   No Known Allergies      Medication List     STOP taking these medications    metroNIDAZOLE 500 MG tablet Commonly known as: FLAGYL   naproxen 500 MG tablet Commonly known as: NAPROSYN   ondansetron 8 MG disintegrating tablet Commonly known as: ZOFRAN-ODT       TAKE these medications      Indication  busPIRone 5 MG tablet Commonly known as: BUSPAR Take 1 tablet (5 mg total) by mouth 3 (three) times daily. What changed: when to take this  Indication: Anxiety Disorder   escitalopram 20 MG tablet Commonly known as: LEXAPRO Take 1 tablet (20 mg total) by mouth daily. What changed:  medication strength how much to take when to take this  Indication: Major Depressive Disorder       Follow-up recommendations:   Follow up with outpatient psychiatry and counseling  Signed: Lauree Chandler, NP 12/30/2022, 8:26 AM

## 2022-12-30 NOTE — Progress Notes (Signed)
  [facility] Adult Case Management Discharge Plan :  Will you be returning to the same living situation after discharge:  Yes,  pt plans to return home upon discharge. At discharge, do you have transportation home?: Yes,  support system to provide transportation.  Do you have the ability to pay for your medications: Yes,  [employer].  Release of information consent forms completed and in the chart;  Patient's signature needed at discharge.  Patient to Follow up at:  Follow-up Information     [facility]. Call.   Why: Provider was contacted to schedule/see about next appointment and make aware of discharge. Please follow up with them directly. If they provide any further information to [place], you will be contacted directly by SW. Thanks! Contact information: [address] [place], [place] [address] Phone: [phone]        [facility]. Call.   Why: Provider was contacted to schedule/see about next appointment and make aware of discharge. If they provide any further information to [place], you will be contacted directly by SW. Please follow up with them directly. Thanks! Contact information: [address], [address]  [place], [place] [address] Telephone: [phone]  Fax: [phone]                Next level of care provider has access to [facility] Link:no  Safety Planning and Suicide Prevention discussed: Yes,  SPE completed with her mother, Leonarda Leclaire.     Has patient been referred to the Quitline?: Patient does not use tobacco/nicotine products  Patient has been referred for addiction treatment: No known substance use disorder.  Glenis Smoker, LCSW 12/30/2022, 1:42 PM

## 2022-12-30 NOTE — Progress Notes (Signed)
Patient pleasant and cooperative on approach. Denies SI,HI and AVH. Verbalized understanding discharge instructions,prescriptions and follow up care.  All belongings returned from BMU locker. Suicide safety plan filled by patient and placed in chart. Copy given to patient.Patient escorted out by staff and transported by family. 

## 2022-12-30 NOTE — BHH Counselor (Signed)
CSW met with pt briefly to inform that her providers had been contacted but that CSW was only able to leave a voicemail for them to call back. CSW informed pt that if he hears back from them then he will contact her directly to update regarding appointment times. CSW also shared that she could contact them on her own. Pt shared that she could because she usually had to email or text them. CSW voiced understanding. Pt inquired about her time to leave. CSW informed her that he would let her nurse know and follow back up with her. No other concerns expressed. Contact ended without incident.   CSW updated nurse about completion of appointment setting. She was made aware that pt is ready to leave when her ride arrives. No other concerns expressed.  Vilma Meckel. Algis Greenhouse, MSW, LCSW, LCAS 12/30/2022 1:50 PM

## 2023-03-18 IMAGING — CR DG CHEST 2V
2 series · 2 of 2 positions shown · non-contrast
Comparison: 06/11/2021

CLINICAL DATA: Cough, fever, body aches 1 week

EXAM:
CHEST - 2 VIEW

[w chest pa]
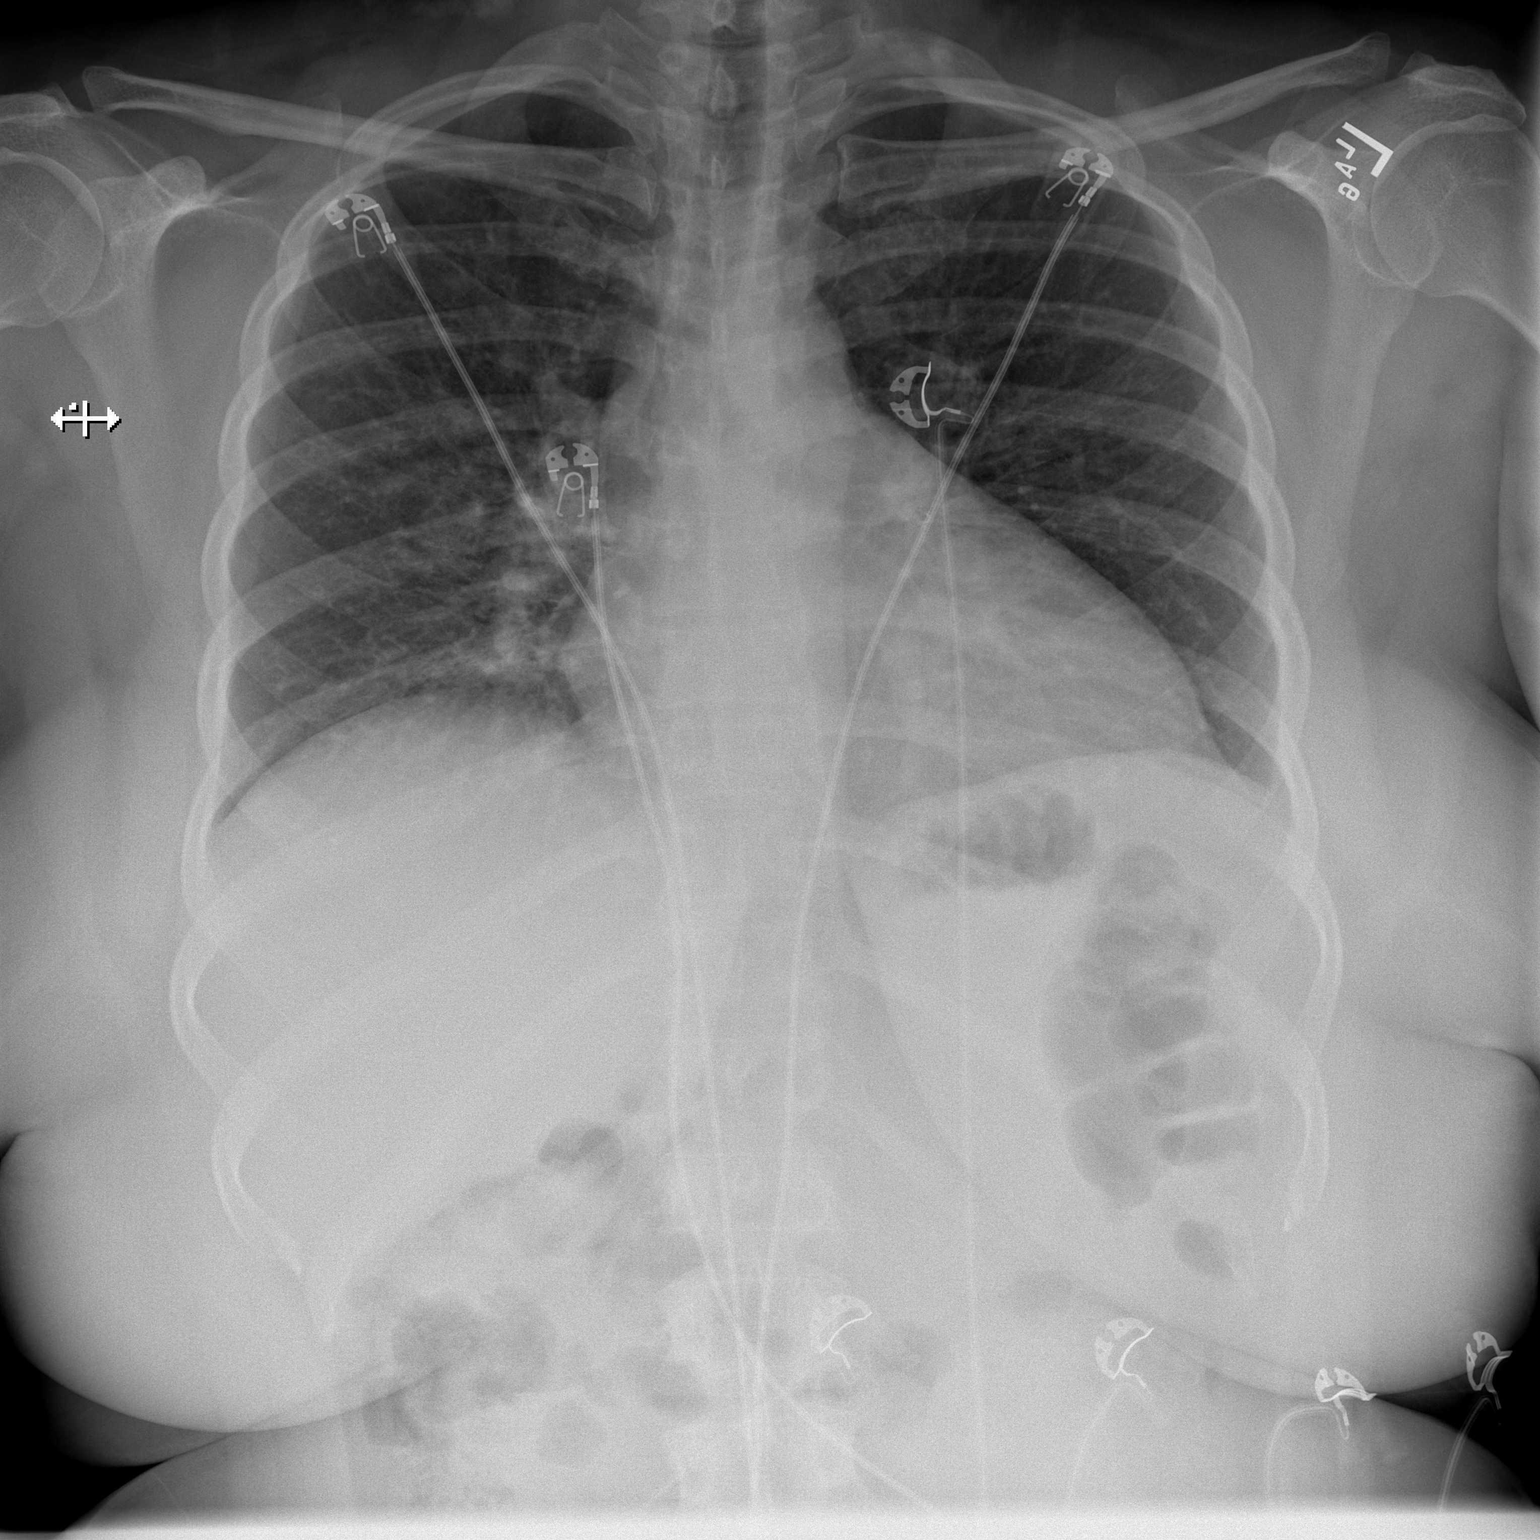

[w chest lat]
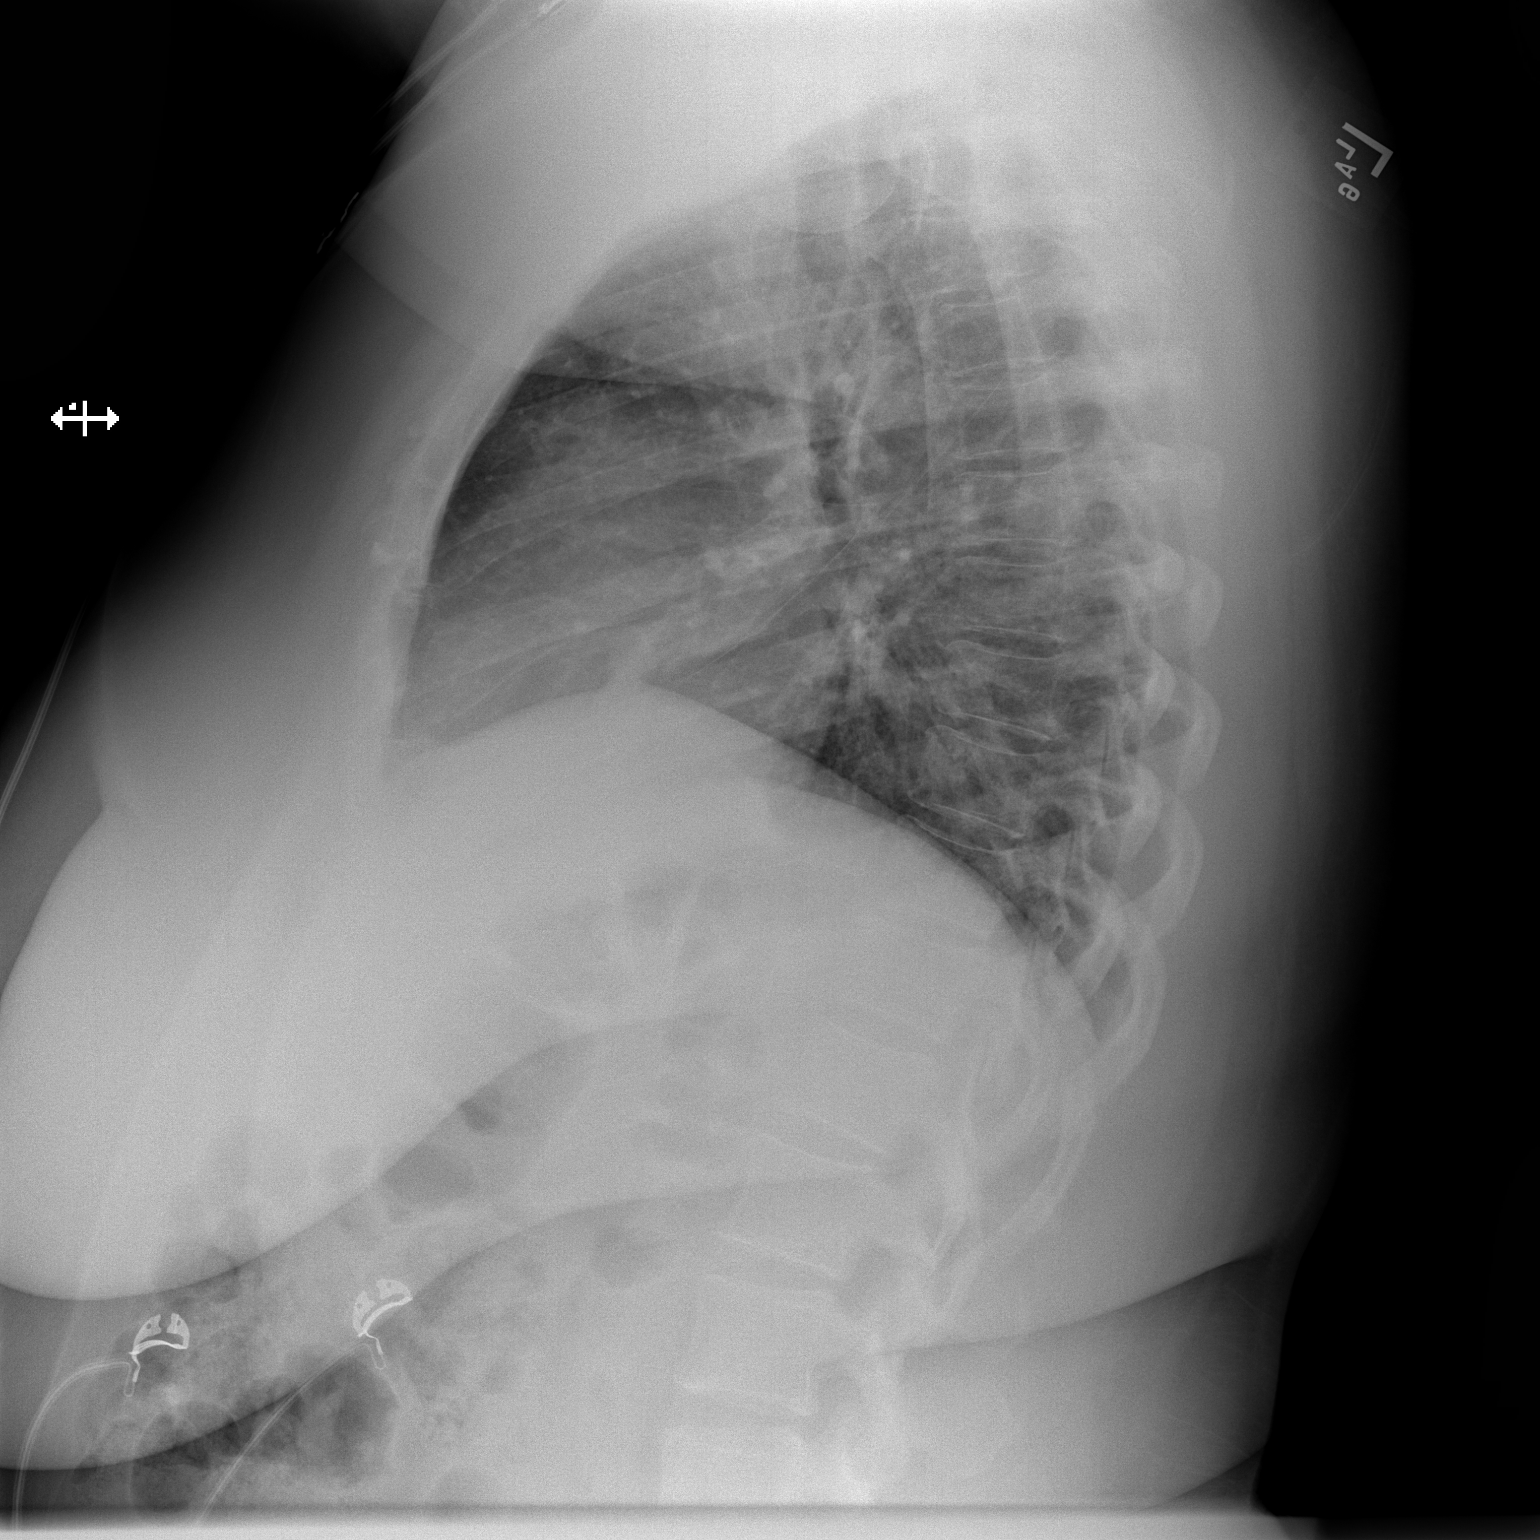

[2 of 2 positions shown; findings below may reference images not displayed]

FINDINGS: Frontal and lateral views of the chest demonstrate an unremarkable
cardiac silhouette. Patchy right lower lobe consolidation consistent
with pneumonia. No effusion or pneumothorax. No acute bony
abnormalities.
IMPRESSION: 1. Patchy right lower lobe bronchopneumonia.

## 2023-12-01 ENCOUNTER — Emergency Department (HOSPITAL_BASED_OUTPATIENT_CLINIC_OR_DEPARTMENT_OTHER)
Admission: EM | Admit: 2023-12-01 | Discharge: 2023-12-01 | Disposition: A | Discharge status: Home / Self Care | Attending: Emergency Medicine | Admitting: Emergency Medicine

## 2023-12-01 ENCOUNTER — Other Ambulatory Visit: Payer: Self-pay

## 2023-12-01 ENCOUNTER — Encounter (HOSPITAL_BASED_OUTPATIENT_CLINIC_OR_DEPARTMENT_OTHER): Payer: Self-pay

## 2023-12-01 DIAGNOSIS — W19XXXA Unspecified fall, initial encounter: Secondary | ICD-10-CM

## 2023-12-01 DIAGNOSIS — W010XXA Fall on same level from slipping, tripping and stumbling without subsequent striking against object, initial encounter: Secondary | ICD-10-CM | POA: Diagnosis not present

## 2023-12-01 DIAGNOSIS — R609 Edema, unspecified: Secondary | ICD-10-CM

## 2023-12-01 DIAGNOSIS — R600 Localized edema: Secondary | ICD-10-CM | POA: Diagnosis not present

## 2023-12-01 DIAGNOSIS — M7989 Other specified soft tissue disorders: Secondary | ICD-10-CM | POA: Diagnosis present

## 2023-12-01 DIAGNOSIS — M79661 Pain in right lower leg: Secondary | ICD-10-CM | POA: Insufficient documentation

## 2023-12-01 DIAGNOSIS — M79662 Pain in left lower leg: Secondary | ICD-10-CM | POA: Diagnosis not present

## 2023-12-01 NOTE — Discharge Instructions (Signed)
 Please reach out to your primary care provider regarding your prescription for amlodipine  as this may be contributing to your pretibial edema as this can sometimes be a side effect of the medication.  Your trauma evaluation was reassuring today.
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
